# Patient Record
Sex: Female | Born: 1990 | Race: Black or African American | Hispanic: No | Marital: Single | Smoking: Current every day smoker
Health system: Southern US, Community
[De-identification: ages and names within clinical notes are randomized; demographics above are authoritative.]

## PROBLEM LIST (undated history)

## (undated) ENCOUNTER — Inpatient Hospital Stay (HOSPITAL_COMMUNITY): Payer: Self-pay

## (undated) DIAGNOSIS — Z789 Other specified health status: Secondary | ICD-10-CM

## (undated) HISTORY — PX: LUMBAR PUNCTURE: SHX1985

---

## 2008-02-19 ENCOUNTER — Other Ambulatory Visit: Admission: RE | Admit: 2008-02-19 | Discharge: 2008-02-19 | Payer: Self-pay | Admitting: Family Medicine

## 2011-03-27 ENCOUNTER — Inpatient Hospital Stay (INDEPENDENT_AMBULATORY_CARE_PROVIDER_SITE_OTHER)
Admission: RE | Admit: 2011-03-27 | Discharge: 2011-03-27 | Disposition: A | Discharge status: Home / Self Care | Payer: Self-pay | Source: Ambulatory Visit | Attending: Family Medicine | Admitting: Family Medicine

## 2011-03-27 DIAGNOSIS — S139XXA Sprain of joints and ligaments of unspecified parts of neck, initial encounter: Secondary | ICD-10-CM

## 2012-02-23 ENCOUNTER — Encounter (HOSPITAL_COMMUNITY): Payer: Self-pay | Admitting: *Deleted

## 2012-02-23 ENCOUNTER — Emergency Department (HOSPITAL_COMMUNITY)
Admission: EM | Admit: 2012-02-23 | Discharge: 2012-02-23 | Disposition: A | Discharge status: Home / Self Care | Payer: Self-pay | Attending: Emergency Medicine | Admitting: Emergency Medicine

## 2012-02-23 DIAGNOSIS — B349 Viral infection, unspecified: Secondary | ICD-10-CM

## 2012-02-23 DIAGNOSIS — R1084 Generalized abdominal pain: Secondary | ICD-10-CM | POA: Insufficient documentation

## 2012-02-23 DIAGNOSIS — R197 Diarrhea, unspecified: Secondary | ICD-10-CM | POA: Insufficient documentation

## 2012-02-23 DIAGNOSIS — F17200 Nicotine dependence, unspecified, uncomplicated: Secondary | ICD-10-CM | POA: Insufficient documentation

## 2012-02-23 DIAGNOSIS — B9789 Other viral agents as the cause of diseases classified elsewhere: Secondary | ICD-10-CM | POA: Insufficient documentation

## 2012-02-23 LAB — COMPREHENSIVE METABOLIC PANEL WITH GFR
Albumin: 3.9 g/dL (ref 3.5–5.2)
BUN: 8 mg/dL (ref 6–23)
Creatinine, Ser: 0.83 mg/dL (ref 0.50–1.10)
GFR calc Af Amer: >90 mL/min (ref >90)
Glucose, Bld: 114 mg/dL — ABNORMAL HIGH (ref 70–99)
Total Protein: 8.2 g/dL (ref 6.0–8.3)

## 2012-02-23 LAB — CBC WITH DIFFERENTIAL/PLATELET
Basophils Absolute: 0 K/uL (ref 0.0–0.1)
Basophils Relative: 0 % (ref 0–1)
Eosinophils Absolute: 0.1 K/uL (ref 0.0–0.7)
Eosinophils Relative: 1 % (ref 0–5)
HCT: 31.7 % — ABNORMAL LOW (ref 36.0–46.0)
Hemoglobin: 9.8 g/dL — ABNORMAL LOW (ref 12.0–15.0)
Lymphocytes Relative: 8 % — ABNORMAL LOW (ref 12–46)
Lymphs Abs: 1.1 K/uL (ref 0.7–4.0)
MCH: 19.3 pg — ABNORMAL LOW (ref 26.0–34.0)
MCHC: 30.9 g/dL (ref 30.0–36.0)
MCV: 62.4 fL — ABNORMAL LOW (ref 78.0–100.0)
Monocytes Absolute: 0.7 K/uL (ref 0.1–1.0)
Monocytes Relative: 5 % (ref 3–12)
Neutro Abs: 12.1 K/uL — ABNORMAL HIGH (ref 1.7–7.7)
Neutrophils Relative %: 86 % — ABNORMAL HIGH (ref 43–77)
Platelets: 388 10*3/uL (ref 150–400)
RBC: 5.08 MIL/uL (ref 3.87–5.11)
RDW: 17.3 % — ABNORMAL HIGH (ref 11.5–15.5)
WBC: 14 10*3/uL — ABNORMAL HIGH (ref 4.0–10.5)

## 2012-02-23 LAB — COMPREHENSIVE METABOLIC PANEL
ALT: 14 U/L (ref 0–35)
AST: 25 U/L (ref 0–37)
Alkaline Phosphatase: 59 U/L (ref 39–117)
CO2: 25 mEq/L (ref 19–32)
Calcium: 9.6 mg/dL (ref 8.4–10.5)
Chloride: 101 mEq/L (ref 96–112)
GFR calc non Af Amer: >90 mL/min (ref >90)
Potassium: 3.7 mEq/L (ref 3.5–5.1)
Sodium: 137 mEq/L (ref 135–145)
Total Bilirubin: 0.5 mg/dL (ref 0.3–1.2)

## 2012-02-23 LAB — URINALYSIS, ROUTINE W REFLEX MICROSCOPIC
Bilirubin Urine: NEGATIVE
Glucose, UA: NEGATIVE mg/dL
Hgb urine dipstick: NEGATIVE
Ketones, ur: 15 mg/dL — AB
Nitrite: NEGATIVE
Protein, ur: NEGATIVE mg/dL
Specific Gravity, Urine: 1.027 (ref 1.005–1.030)
Urobilinogen, UA: 0.2 mg/dL (ref 0.0–1.0)
pH: 5.5 (ref 5.0–8.0)

## 2012-02-23 LAB — PREGNANCY, URINE: Preg Test, Ur: NEGATIVE

## 2012-02-23 LAB — URINE MICROSCOPIC-ADD ON

## 2012-02-23 MED ORDER — GI COCKTAIL ~~LOC~~
30.0000 mL | Freq: Once | ORAL | Status: AC
  Administered 2012-02-23: 30 mL via ORAL
  Filled 2012-02-23: qty 30

## 2012-02-23 MED ORDER — ONDANSETRON HCL 4 MG/2ML IJ SOLN
4.0000 mg | Freq: Once | INTRAMUSCULAR | Status: AC
  Administered 2012-02-23: 4 mg via INTRAVENOUS
  Filled 2012-02-23: qty 2

## 2012-02-23 MED ORDER — PROMETHAZINE HCL 25 MG PO TABS: 25.0000 mg | ORAL_TABLET | Freq: Four times a day (QID) | ORAL | Status: DC | PRN

## 2012-02-23 MED ORDER — KETOROLAC TROMETHAMINE 30 MG/ML IJ SOLN
30.0000 mg | Freq: Once | INTRAMUSCULAR | Status: AC
  Administered 2012-02-23: 30 mg via INTRAVENOUS
  Filled 2012-02-23: qty 1

## 2012-02-23 NOTE — ED Notes (Signed)
The pt has had  Lower abd pain for 3 days with  and diarrhea.  lmp none implant

## 2012-02-23 NOTE — ED Notes (Signed)
Pt unable to urinate.

## 2012-02-23 NOTE — ED Provider Notes (Signed)
History     CSN: [id number]  Arrival date & time 02/23/12  0251   First MD Initiated Contact with Patient 02/23/12 [phone]      Chief Complaint  Patient presents with  . Abdominal Pain   HPI  History provided by the patient. Patient is a 21 year old female with no significant past medical history who presents with complaints of diarrhea and abdominal discomfort for the past 3 days. Symptoms have also been associated with nausea and decreased appetite. Patient reports having intermittent cramping abdominal pains. Pain is diffuse. Symptoms have been worsening over the past few days. Patient reports numerous watery diarrhea episodes. She denies any blood or mucus in stool. She denies any previous history of diarrhea constipation symptoms. No family history of colitis. Patient denies any fever, chills or sweats. She has not traveled recently. Denies any known sick contacts. Denies any dysuria, hematuria, urinary frequency, flank pain, vaginal bleeding or vaginal discharge.    History reviewed. No pertinent past medical history.  History reviewed. No pertinent past surgical history.  No family history on file.  History  Substance Use Topics  . Smoking status: Current Everyday Smoker  . Smokeless tobacco: Not on file  . Alcohol Use: Yes    OB History    Grav Para Term Preterm Abortions TAB SAB Ect Mult Living                  Review of Systems  Constitutional: Positive for appetite change. Negative for fever and chills.  Gastrointestinal: Positive for nausea, abdominal pain and diarrhea. Negative for vomiting and constipation.  Genitourinary: Negative for dysuria, frequency, hematuria, flank pain, vaginal bleeding and vaginal discharge.    Allergies  Review of patient's allergies indicates no known allergies.  Home Medications  No current outpatient prescriptions on file.  BP 134/81  Pulse 95  Temp 97.8 F (36.6 C) (Oral)  Resp 20  SpO2 98%  Physical Exam  Nursing note  and vitals reviewed. Constitutional: She is oriented to person, place, and time. She appears well-developed and well-nourished. No distress.  HENT:  Head: Normocephalic.  Mouth/Throat: Oropharynx is clear and moist.  Cardiovascular: Normal rate and regular rhythm.   Pulmonary/Chest: Effort normal and breath sounds normal.  Abdominal: Soft. She exhibits no distension and no mass. There is no rebound and no guarding.       Mild diffuse tenderness. Patient is obese.  Neurological: She is alert and oriented to person, place, and time.  Skin: Skin is warm and dry. No erythema.  Psychiatric: She has a normal mood and affect. Her behavior is normal.    ED Course  Procedures   Results for orders placed during the hospital encounter of 02/23/12  URINALYSIS, ROUTINE W REFLEX MICROSCOPIC      Component Value Range   Color, Urine YELLOW  YELLOW   APPearance CLEAR  CLEAR   Specific Gravity, Urine 1.027  1.005 - 1.030   pH 5.5  5.0 - 8.0   Glucose, UA NEGATIVE  NEGATIVE mg/dL   Hgb urine dipstick NEGATIVE  NEGATIVE   Bilirubin Urine NEGATIVE  NEGATIVE   Ketones, ur 15 (*) NEGATIVE mg/dL   Protein, ur NEGATIVE  NEGATIVE mg/dL   Urobilinogen, UA 0.2  0.0 - 1.0 mg/dL   Nitrite NEGATIVE  NEGATIVE   Leukocytes, UA SMALL (*) NEGATIVE  PREGNANCY, URINE      Component Value Range   Preg Test, Ur NEGATIVE  NEGATIVE  CBC WITH DIFFERENTIAL  Component Value Range   WBC 14.0 (*) 4.0 - 10.5 K/uL   RBC 5.08  3.87 - 5.11 MIL/uL   Hemoglobin 9.8 (*) 12.0 - 15.0 g/dL   HCT 40.9 (*) 81.1 - 91.4 %   MCV 62.4 (*) 78.0 - 100.0 fL   MCH 19.3 (*) 26.0 - 34.0 pg   MCHC 30.9  30.0 - 36.0 g/dL   RDW 78.2 (*) 95.6 - 21.3 %   Platelets 388  150 - 400 K/uL   Neutrophils Relative 86 (*) 43 - 77 %   Lymphocytes Relative 8 (*) 12 - 46 %   Monocytes Relative 5  3 - 12 %   Eosinophils Relative 1  0 - 5 %   Basophils Relative 0  0 - 1 %   Neutro Abs 12.1 (*) 1.7 - 7.7 K/uL   Lymphs Abs 1.1  0.7 - 4.0 K/uL    Monocytes Absolute 0.7  0.1 - 1.0 K/uL   Eosinophils Absolute 0.1  0.0 - 0.7 K/uL   Basophils Absolute 0.0  0.0 - 0.1 K/uL   RBC Morphology POLYCHROMASIA PRESENT    COMPREHENSIVE METABOLIC PANEL      Component Value Range   Sodium 137  135 - 145 mEq/L   Potassium 3.7  3.5 - 5.1 mEq/L   Chloride 101  96 - 112 mEq/L   CO2 25  19 - 32 mEq/L   Glucose, Bld 114 (*) 70 - 99 mg/dL   BUN 8  6 - 23 mg/dL   Creatinine, Ser 0.86  0.50 - 1.10 mg/dL   Calcium 9.6  8.4 - 57.8 mg/dL   Total Protein 8.2  6.0 - 8.3 g/dL   Albumin 3.9  3.5 - 5.2 g/dL   AST 25  0 - 37 U/L   ALT 14  0 - 35 U/L   Alkaline Phosphatase 59  39 - 117 U/L   Total Bilirubin 0.5  0.3 - 1.2 mg/dL   GFR calc non Af Amer >90  >90 mL/min   GFR calc Af Amer >90  >90 mL/min  LIPASE, BLOOD      Component Value Range   Lipase 23  11 - 59 U/L  URINE MICROSCOPIC-ADD ON      Component Value Range   Squamous Epithelial / LPF MANY (*) RARE   WBC, UA 0-2  <3 WBC/hpf   RBC / HPF 0-2  <3 RBC/hpf   Bacteria, UA MANY (*) RARE   Urine-Other MUCOUS PRESENT         1. Diarrhea   2. Viral syndrome       MDM  4:20AM patient seen and evaluated. Patient with nausea and diarrhea symptoms. Patient reports multiple watery loose stools. No blood or mucus. Patient with soft abdomen mild tenderness. No peritoneal signs.   Patient feeling better after pain medications and IV fluids. Reexam of the abdomen shows very mild diffuse tenderness. There is no signs concerning for appendicitis. No peritoneal signs. At this time given patient's amount of diarrhea symptoms suspect viral GI process. Patient given strict return precautions and advised to have recheck of symptoms in the next 2 days if not improved.        Angus Seller, [place] 02/24/12 [phone]

## 2012-02-24 NOTE — ED Provider Notes (Signed)
Medical screening examination/treatment/procedure(s) were performed by non-physician practitioner and as supervising physician I was immediately available for consultation/collaboration.   Gavin Pound. Oletta Lamas, MD 02/24/12 [phone]

## 2014-05-12 ENCOUNTER — Emergency Department (HOSPITAL_COMMUNITY)
Admission: EM | Admit: 2014-05-12 | Discharge: 2014-05-12 | Disposition: A | Discharge status: Home / Self Care | Payer: Self-pay | Attending: Emergency Medicine | Admitting: Emergency Medicine

## 2014-05-12 ENCOUNTER — Encounter (HOSPITAL_COMMUNITY): Payer: Self-pay | Admitting: Emergency Medicine

## 2014-05-12 DIAGNOSIS — Z720 Tobacco use: Secondary | ICD-10-CM | POA: Insufficient documentation

## 2014-05-12 DIAGNOSIS — J069 Acute upper respiratory infection, unspecified: Secondary | ICD-10-CM | POA: Insufficient documentation

## 2014-05-12 DIAGNOSIS — B9789 Other viral agents as the cause of diseases classified elsewhere: Secondary | ICD-10-CM

## 2014-05-12 DIAGNOSIS — R0981 Nasal congestion: Secondary | ICD-10-CM

## 2014-05-12 DIAGNOSIS — R0789 Other chest pain: Secondary | ICD-10-CM | POA: Insufficient documentation

## 2014-05-12 LAB — RAPID STREP SCREEN (MED CTR MEBANE ONLY): Streptococcus, Group A Screen (Direct): NEGATIVE

## 2014-05-12 MED ORDER — ALBUTEROL SULFATE HFA 108 (90 BASE) MCG/ACT IN AERS
2.0000 | INHALATION_SPRAY | RESPIRATORY_TRACT | Status: DC | PRN
  Administered 2014-05-12: 2 via RESPIRATORY_TRACT
  Filled 2014-05-12: qty 6.7

## 2014-05-12 MED ORDER — AEROCHAMBER PLUS W/MASK MISC
1.0000 | Freq: Once | Status: AC
  Administered 2014-05-12: 1
  Filled 2014-05-12: qty 1

## 2014-05-12 MED ORDER — BENZONATATE 100 MG PO CAPS: 100.0000 mg | ORAL_CAPSULE | Freq: Three times a day (TID) | ORAL | Status: DC

## 2014-05-12 MED ORDER — FLUTICASONE PROPIONATE 50 MCG/ACT NA SUSP: 2.0000 | Freq: Every day | NASAL | Status: DC

## 2014-05-12 NOTE — ED Notes (Addendum)
Congestion, non productive cough, pleurisy, and sore throat.

## 2014-05-12 NOTE — ED Notes (Signed)
Declined W/C at D/C and was escorted to lobby by RN. 

## 2014-05-12 NOTE — Discharge Instructions (Signed)
1. Medications: flonase, mucinex, tessalon, albuterolusual home medications 2. Treatment: rest, drink plenty of fluids, take tylenol or ibuprofen for fever control 3. Follow Up: Please followup with your primary doctor in 3 days for discussion of your diagnoses and further evaluation after today's visit; if you do not have a primary care doctor use the resource guide provided to find one; Return to the ER for high fevers, difficulty breathing or other concerning symptoms   Upper Respiratory Infection, Adult An upper respiratory infection (URI) is also known as the common cold. It is often caused by a type of germ (virus). Colds are easily spread (contagious). You can pass it to others by kissing, coughing, sneezing, or drinking out of the same glass. Usually, you get better in 1 or 2 weeks.  HOME CARE   Only take medicine as told by your doctor.  Use a warm mist humidifier or breathe in steam from a hot shower.  Drink enough water and fluids to keep your pee (urine) clear or pale yellow.  Get plenty of rest.  Return to work when your temperature is back to normal or as told by your doctor. You may use a face mask and wash your hands to stop your cold from spreading. GET HELP RIGHT AWAY IF:   After the first few days, you feel you are getting worse.  You have questions about your medicine.  You have chills, shortness of breath, or brown or red spit (mucus).  You have yellow or brown snot (nasal discharge) or pain in the face, especially when you bend forward.  You have a fever, puffy (swollen) neck, pain when you swallow, or white spots in the back of your throat.  You have a bad headache, ear pain, sinus pain, or chest pain.  You have a high-pitched whistling sound when you breathe in and out (wheezing).  You have a lasting cough or cough up blood.  You have sore muscles or a stiff neck. MAKE SURE YOU:   Understand these instructions.  Will watch your condition.  Will get  help right away if you are not doing well or get worse. Document Released: 12/25/2007 Document Revised: 09/30/2011 Document Reviewed: 10/13/2013 [facility] Patient Information 2015 [place], [place]. This information is not intended to replace advice given to you by your health care provider. Make sure you discuss any questions you have with your health care provider.

## 2014-05-12 NOTE — ED Provider Notes (Signed)
CSN: [id number]     Arrival date & time 05/12/14  1737 History  This chart was scribed for non-physician practitioner, Dierdre ForthHannah Shriyan Arakawa, PA-C, working with Audree CamelScott T Goldston, MD, by Bronson CurbJacqueline Melvin, ED Scribe. This patient was seen in room TR07C/TR07C and the patient's care was started at 5:58 PM.    Chief Complaint  Patient presents with  . Nasal Congestion  . Pleurisy  . Sore Throat    The history is provided by the patient and medical records. No language interpreter was used.    HPI Comments: Summer Lee is a 23 y.o. female who presents to the Emergency Department complaining of sore throat that began 3 days ago. Patient states her feels are consistent with the "common cold", but is here today to rule out other possibilities. There is associated nasal congestion, sinus pressure/pain, loss of appetite, cough, CP (with hard bouts of cough only) and SOB with cough only. She also notes subjective fever and chills, 3 days ago, that have since resolved. Patient has tried cough drops, hot tea, Mucinex, and Dayquil without significant improvement. She denies otalgia, nausea, or vomiting. She denies sick contacts. Patient has no history of significant medical conditions.   History reviewed. No pertinent past medical history. History reviewed. No pertinent past surgical history. No family history on file. History  Substance Use Topics  . Smoking status: Current Every Day Smoker  . Smokeless tobacco: Not on file  . Alcohol Use: Yes   OB History   Grav Para Term Preterm Abortions TAB SAB Ect Mult Living                 Review of Systems  Constitutional: Positive for appetite change (decreased) and fatigue. Negative for fever and chills.  HENT: Positive for congestion, postnasal drip, rhinorrhea, sinus pressure and sore throat. Negative for ear discharge, ear pain and mouth sores.   Eyes: Negative for visual disturbance.  Respiratory: Positive for cough, chest tightness (mild) and  shortness of breath. Negative for wheezing and stridor.   Cardiovascular: Positive for chest pain (with cough only). Negative for palpitations and leg swelling.  Gastrointestinal: Negative for nausea, vomiting, abdominal pain and diarrhea.  Genitourinary: Negative for dysuria, urgency, frequency and hematuria.  Musculoskeletal: Positive for myalgias. Negative for arthralgias, back pain and neck stiffness.  Skin: Negative for rash.  Neurological: Positive for headaches (frontal). Negative for syncope, light-headedness and numbness.  Hematological: Negative for adenopathy.  Psychiatric/Behavioral: The patient is not nervous/anxious.   All other systems reviewed and are negative.     Allergies  Review of patient's allergies indicates no known allergies.  Home Medications   Prior to Admission medications   Medication Sig Start Date End Date Taking? Authorizing Provider  benzonatate (TESSALON) 100 MG capsule Take 1-2 capsules (100-200 mg total) by mouth every 8 (eight) hours. 05/12/14   Teyona Nichelson, PA-C  fluticasone (FLONASE) 50 MCG/ACT nasal spray Place 2 sprays into both nostrils daily. 05/12/14   Jamin Humphries, PA-C  ibuprofen (ADVIL,MOTRIN) 200 MG tablet Take 600-800 mg by mouth every 6 (six) hours as needed. For pain    Historical Provider, MD  promethazine (PHENERGAN) 25 MG tablet Take 1 tablet (25 mg total) by mouth every 6 (six) hours as needed for nausea. 02/23/12 03/01/12  Angus SellerPeter S Dammen, PA-C   Triage Vitals: BP 121/92  Pulse 120  Temp(Src) 99 F (37.2 C)  Resp 18  Ht 5\' 6"  (1.676 m)  Wt 180 lb (81.647 kg)  BMI 29.07 kg/m2  SpO2  100%  Physical Exam  Nursing note and vitals reviewed. Constitutional: She is oriented to person, place, and time. She appears well-developed and well-nourished. No distress.  HENT:  Head: Normocephalic and atraumatic.  Right Ear: Tympanic membrane, external ear and ear canal normal.  Left Ear: Tympanic membrane, external ear and  ear canal normal.  Nose: Mucosal edema and rhinorrhea present. No epistaxis. Right sinus exhibits no maxillary sinus tenderness and no frontal sinus tenderness. Left sinus exhibits no maxillary sinus tenderness and no frontal sinus tenderness.  Mouth/Throat: Uvula is midline, oropharynx is clear and moist and mucous membranes are normal. Mucous membranes are not pale and not cyanotic. No oropharyngeal exudate, posterior oropharyngeal edema, posterior oropharyngeal erythema or tonsillar abscesses.  Eyes: Conjunctivae are normal. Pupils are equal, round, and reactive to light.  Neck: Normal range of motion and full passive range of motion without pain.  Cardiovascular: Normal rate, normal heart sounds and intact distal pulses.   No murmur heard. Pulmonary/Chest: Effort normal and breath sounds normal. No stridor. No respiratory distress. She has no wheezes.  Clear and equal breath sounds without focal wheezes, rhonchi, rales. Mild TTP to the anterior chest.  Abdominal: Soft. Bowel sounds are normal. She exhibits no distension. There is no tenderness.  Musculoskeletal: Normal range of motion.  Lymphadenopathy:    She has no cervical adenopathy.  Neurological: She is alert and oriented to person, place, and time.  Skin: Skin is warm and dry. No rash noted. She is not diaphoretic. No erythema.  Psychiatric: She has a normal mood and affect.    ED Course  Procedures (including critical care time)  DIAGNOSTIC STUDIES: Oxygen Saturation is 100% on room air, normal by my interpretation.    COORDINATION OF CARE: At 24 Discussed treatment plan with patient which includes rapid strep screen. Patient agrees.   Labs Review Labs Reviewed  RAPID STREP SCREEN  CULTURE, GROUP A STREP    Imaging Review No results found.   EKG Interpretation None      Medications  albuterol (PROVENTIL HFA;VENTOLIN HFA) 108 (90 BASE) MCG/ACT inhaler 2 puff (2 puffs Inhalation Given 05/12/14 1824)  aerochamber  plus with mask device 1 each (1 each Other Given 05/12/14 1827)     MDM   Final diagnoses:  Viral URI with cough  Chest wall pain  Sinus congestion   Summer Lee presents with URI symptoms.  Pt afebrile with clear and equal breath sounds, no indication for chest x-ray this time.. Patients symptoms are consistent with URI, likely viral etiology. Strep test  negative  Discussed that antibiotics are not indicated for viral infections. Pt will be discharged with symptomatic treatment.  Verbalizes understanding and is agreeable with plan. Pt is hemodynamically stable & in NAD prior to dc.  I have personally reviewed patient's vitals, nursing note and any pertinent labs or imaging.  I performed an focused physical exam; undressed when appropriate .    It has been determined that no acute conditions requiring further emergency intervention are present at this time. The patient/guardian have been advised of the diagnosis and plan. I reviewed any labs and imaging including any potential incidental findings. We have discussed signs and symptoms that warrant return to the ED and they are listed in the discharge instructions.    Vital signs are stable at discharge.  Pt initially tachycardic at triage, but no tachycardia on physical exam.  BP 121/92  Pulse 96  Temp(Src) 97.8 F (36.6 C) (Oral)  Resp 18  Ht 5'  6" (1.676 m)  Wt 180 lb (81.647 kg)  BMI 29.07 kg/m2  SpO2 100%  I personally performed the services described in this documentation, which was scribed in my presence. The recorded information has been reviewed and is accurate.   Dahlia ClientHannah Vonn Sliger, PA-C 05/12/14 1845

## 2014-05-13 NOTE — ED Provider Notes (Signed)
Medical screening examination/treatment/procedure(s) were performed by non-physician practitioner and as supervising physician I was immediately available for consultation/collaboration.   EKG Interpretation None        Audree CamelScott T Freemon Binford, MD 05/13/14 [phone]

## 2014-05-14 LAB — CULTURE, GROUP A STREP

## 2014-07-22 NOTE — L&D Delivery Note (Signed)
Delivery Note At 10:24 AM a viable and healthy female was delivered via Vaginal, Spontaneous Delivery (Presentation: Direct; Occiput Anterior).  APGAR: 8, 9; weight  pending.   Placenta status: Intact, Spontaneous.  Cord: 3 vessels with the a loose nuchal cord that could not be reduced prior to delivery of the body:    Anesthesia: Epidural  Episiotomy: None Lacerations:  None Suture Repair: None Est. Blood Loss (mL):  400  Mom to AICU.  Baby to Couplet care / Skin to Skin.  Jevan Gaunt H. 03/21/2015, 11:21 AM

## 2014-08-17 ENCOUNTER — Encounter (HOSPITAL_COMMUNITY): Payer: Self-pay | Admitting: *Deleted

## 2014-08-17 ENCOUNTER — Inpatient Hospital Stay (HOSPITAL_COMMUNITY): Payer: Self-pay

## 2014-08-17 ENCOUNTER — Inpatient Hospital Stay (HOSPITAL_COMMUNITY)
Admission: AD | Admit: 2014-08-17 | Discharge: 2014-08-17 | Disposition: A | Discharge status: Home / Self Care | Payer: Self-pay | Source: Ambulatory Visit | Attending: Obstetrics & Gynecology | Admitting: Obstetrics & Gynecology

## 2014-08-17 DIAGNOSIS — R1031 Right lower quadrant pain: Secondary | ICD-10-CM | POA: Insufficient documentation

## 2014-08-17 DIAGNOSIS — O26899 Other specified pregnancy related conditions, unspecified trimester: Secondary | ICD-10-CM

## 2014-08-17 DIAGNOSIS — O10011 Pre-existing essential hypertension complicating pregnancy, first trimester: Secondary | ICD-10-CM | POA: Insufficient documentation

## 2014-08-17 DIAGNOSIS — O9989 Other specified diseases and conditions complicating pregnancy, childbirth and the puerperium: Secondary | ICD-10-CM

## 2014-08-17 DIAGNOSIS — O99331 Smoking (tobacco) complicating pregnancy, first trimester: Secondary | ICD-10-CM | POA: Insufficient documentation

## 2014-08-17 DIAGNOSIS — Z3A01 Less than 8 weeks gestation of pregnancy: Secondary | ICD-10-CM | POA: Insufficient documentation

## 2014-08-17 DIAGNOSIS — R109 Unspecified abdominal pain: Secondary | ICD-10-CM

## 2014-08-17 HISTORY — DX: Other specified health status: Z78.9

## 2014-08-17 LAB — ABO/RH: ABO/RH(D): O POS

## 2014-08-17 LAB — URINALYSIS, ROUTINE W REFLEX MICROSCOPIC
Bilirubin Urine: NEGATIVE
Glucose, UA: NEGATIVE mg/dL
Hgb urine dipstick: NEGATIVE
Ketones, ur: NEGATIVE mg/dL
Leukocytes, UA: NEGATIVE
Nitrite: NEGATIVE
Protein, ur: NEGATIVE mg/dL
Specific Gravity, Urine: 1.01 (ref 1.005–1.030)
Urobilinogen, UA: 0.2 mg/dL (ref 0.0–1.0)
pH: 7 (ref 5.0–8.0)

## 2014-08-17 LAB — CBC
HCT: 30.1 % — ABNORMAL LOW (ref 36.0–46.0)
Hemoglobin: 9.4 g/dL — ABNORMAL LOW (ref 12.0–15.0)
MCH: 19.4 pg — ABNORMAL LOW (ref 26.0–34.0)
MCHC: 31.2 g/dL (ref 30.0–36.0)
MCV: 62.1 fL — ABNORMAL LOW (ref 78.0–100.0)
Platelets: 434 K/uL — ABNORMAL HIGH (ref 150–400)
RBC: 4.85 MIL/uL (ref 3.87–5.11)
RDW: 18.5 % — ABNORMAL HIGH (ref 11.5–15.5)
WBC: 8.1 K/uL (ref 4.0–10.5)

## 2014-08-17 LAB — WET PREP, GENITAL
TRICH WET PREP: NONE SEEN
Yeast Wet Prep HPF POC: NONE SEEN

## 2014-08-17 LAB — HCG, QUANTITATIVE, PREGNANCY: HCG, BETA CHAIN, QUANT, S: 69698 m[IU]/mL — AB (ref <5)

## 2014-08-17 LAB — POCT PREGNANCY, URINE: Preg Test, Ur: POSITIVE — AB

## 2014-08-17 MED ORDER — DOXYLAMINE-PYRIDOXINE 10-10 MG PO TBEC: 2.0000 | DELAYED_RELEASE_TABLET | Freq: Every evening | ORAL | Status: DC | PRN

## 2014-08-17 NOTE — MAU Note (Signed)
Urine in lab 

## 2014-08-17 NOTE — MAU Provider Note (Signed)
History   Summer Lee is a 24 y/o G1P0 [redacted]w[redacted]d female with a chief complaint of RLQ abdominal pain. Pt describes the pain as cramping and ranks it as a 9/10 in severity at its worse. Pt reports spotting of bright red blood x 2 days, but denies any heavy bleeding. Pt reports nausea and denies vomiting, vaginal discharge, contractions, or sharp pains. Positive at home pregnancy test 2 weeks ago. BP slightly elevated at 154/92.  CSN: [id number]  Arrival date and time: 08/17/14 1245   None     Chief Complaint  Patient presents with  . Abdominal Pain   HPI  OB History    Gravida Para Term Preterm AB TAB SAB Ectopic Multiple Living   1               Past Medical History  Diagnosis Date  . Medical history non-contributory     Past Surgical History  Procedure Laterality Date  . Lumbar puncture      Family History  Problem Relation Age of Onset  . Hypertension Mother   . Hypertension Father   . Hypertension Maternal Grandmother   . Hypertension Paternal Grandmother   . Hypertension Paternal Grandfather     History  Substance Use Topics  . Smoking status: Current Every Day Smoker -- 0.25 packs/day  . Smokeless tobacco: Not on file  . Alcohol Use: No    Allergies: No Known Allergies  Prescriptions prior to admission  Medication Sig Dispense Refill Last Dose  . benzonatate (TESSALON) 100 MG capsule Take 1-2 capsules (100-200 mg total) by mouth every 8 (eight) hours. (Patient not taking: Reported on 08/17/2014) 21 capsule 0   . fluticasone (FLONASE) 50 MCG/ACT nasal spray Place 2 sprays into both nostrils daily. (Patient not taking: Reported on 08/17/2014) 9.9 g 2   . promethazine (PHENERGAN) 25 MG tablet Take 1 tablet (25 mg total) by mouth every 6 (six) hours as needed for nausea. (Patient not taking: Reported on 08/17/2014) 20 tablet 0   . [DISCONTINUED] ibuprofen (ADVIL,MOTRIN) 200 MG tablet Take 600-800 mg by mouth every 6 (six) hours as needed. For pain   02/22/2012 at Unknown     ROS Physical Exam   Blood pressure 154/92, pulse 89, temperature 97.8 F (36.6 C), temperature source Oral, resp. rate 16, height  (1.676 m), weight 80.015 kg (176 lb 6.4 oz), last menstrual period 06/30/2014.  Physical Exam  Constitutional: She is oriented to person, place, and time. She appears well-developed and well-nourished. No distress.  Cardiovascular: Normal rate.   Respiratory: Effort normal and breath sounds normal.  GI: Soft.  Genitourinary: Uterus normal. Vaginal discharge (but no evidence of bleeding on pelvic exam) found.  Neurological: She is alert and oriented to person, place, and time.  Skin: Skin is warm and dry. She is not diaphoretic.  Psychiatric: She has a normal mood and affect.    MAU Course  Procedures  MDM Results for orders placed or performed during the hospital encounter of 08/17/14 (from the past 24 hour(s))  Urinalysis, Routine w reflex microscopic     Status: Abnormal   Collection Time: 08/17/14 12:50 PM  Result Value Ref Range   Color, Urine STRAW (A) YELLOW   APPearance CLEAR CLEAR   Specific Gravity, Urine 1.010 1.005 - 1.030   pH 7.0 5.0 - 8.0   Glucose, UA NEGATIVE NEGATIVE mg/dL   Hgb urine dipstick NEGATIVE NEGATIVE   Bilirubin Urine NEGATIVE NEGATIVE   Ketones, ur NEGATIVE NEGATIVE mg/dL  Protein, ur NEGATIVE NEGATIVE mg/dL   Urobilinogen, UA 0.2 0.0 - 1.0 mg/dL   Nitrite NEGATIVE NEGATIVE   Leukocytes, UA NEGATIVE NEGATIVE  hCG, quantitative, pregnancy     Status: Abnormal   Collection Time: 08/17/14  2:11 PM  Result Value Ref Range   hCG, Beta Chain, Quant, S [id number] (H) <5 mIU/mL  CBC     Status: Abnormal   Collection Time: 08/17/14  2:11 PM  Result Value Ref Range   WBC 8.1 4.0 - 10.5 K/uL   RBC 4.85 3.87 - 5.11 MIL/uL   Hemoglobin 9.4 (L) 12.0 - 15.0 g/dL   HCT 60.430.1 (L) 54.036.0 - 98.146.0 %   MCV 62.1 (L) 78.0 - 100.0 fL   MCH 19.4 (L) 26.0 - 34.0 pg   MCHC 31.2 30.0 - 36.0 g/dL   RDW 19.118.5 (H) 47.811.5 - 29.515.5 %    Platelets 434 (H) 150 - 400 K/uL  ABO/Rh     Status: None   Collection Time: 08/17/14  2:11 PM  Result Value Ref Range   ABO/RH(D) O POS   Pregnancy, urine POC     Status: Abnormal   Collection Time: 08/17/14  2:12 PM  Result Value Ref Range   Preg Test, Ur POSITIVE (A) NEGATIVE  Wet prep, genital     Status: Abnormal   Collection Time: 08/17/14  4:00 PM  Result Value Ref Range   Yeast Wet Prep HPF POC NONE SEEN NONE SEEN   Trich, Wet Prep NONE SEEN NONE SEEN   Clue Cells Wet Prep HPF POC FEW (A) NONE SEEN   WBC, Wet Prep HPF POC FEW (A) NONE SEEN   US showed SIUP at 73761w6d with good FHR and yolk sac, small SCH  Assessment and Plan  A- US showed 25761w6d intrauterine pregnancy with fetal pole and heart beat.       Chronic hypertension  P- Discharge home.       Reassured normal IUP      [facility] to [facility]  Tia Maskerrecht, [place] 08/17/2014, 4:12 PM   Attestation of Attending Supervision of Advanced Practitioner (CNM/NP): Evaluation and management procedures were performed by the Advanced Practitioner under my supervision and collaboration.  I have reviewed the Advanced Practitioner's note and chart, and I agree with the management and plan.  HARRAWAY-SMITH, Jennette Leask 5:07 PM

## 2014-08-17 NOTE — Discharge Instructions (Signed)
First Trimester of Pregnancy The first trimester of pregnancy is from week 1 until the end of week 12 (months 1 through 3). A week after a sperm fertilizes an egg, the egg will implant on the wall of the uterus. This embryo will begin to develop into a baby. Genes from you and your partner are forming the baby. The female genes determine whether the baby is a boy or a girl. At 6-8 weeks, the eyes and face are formed, and the heartbeat can be seen on ultrasound. At the end of 12 weeks, all the baby's organs are formed.  Now that you are pregnant, you will want to do everything you can to have a healthy baby. Two of the most important things are to get good prenatal care and to follow your health care provider's instructions. Prenatal care is all the medical care you receive before the baby's birth. This care will help prevent, find, and treat any problems during the pregnancy and childbirth. BODY CHANGES Your body goes through many changes during pregnancy. The changes vary from woman to woman.   You may gain or lose a couple of pounds at first.  You may feel sick to your stomach (nauseous) and throw up (vomit). If the vomiting is uncontrollable, call your health care provider.  You may tire easily.  You may develop headaches that can be relieved by medicines approved by your health care provider.  You may urinate more often. Painful urination may mean you have a bladder infection.  You may develop heartburn as a result of your pregnancy.  You may develop constipation because certain hormones are causing the muscles that push waste through your intestines to slow down.  You may develop hemorrhoids or swollen, bulging veins (varicose veins).  Your breasts may begin to grow larger and become tender. Your nipples may stick out more, and the tissue that surrounds them (areola) may become darker.  Your gums may bleed and may be sensitive to brushing and flossing.  Dark spots or blotches (chloasma,  mask of pregnancy) may develop on your face. This will likely fade after the baby is born.  Your menstrual periods will stop.  You may have a loss of appetite.  You may develop cravings for certain kinds of food.  You may have changes in your emotions from day to day, such as being excited to be pregnant or being concerned that something may go wrong with the pregnancy and baby.  You may have more vivid and strange dreams.  You may have changes in your hair. These can include thickening of your hair, rapid growth, and changes in texture. Some women also have hair loss during or after pregnancy, or hair that feels dry or thin. Your hair will most likely return to normal after your baby is born. WHAT TO EXPECT AT YOUR PRENATAL VISITS During a routine prenatal visit:  You will be weighed to make sure you and the baby are growing normally.  Your blood pressure will be taken.  Your abdomen will be measured to track your baby's growth.  The fetal heartbeat will be listened to starting around week 10 or 12 of your pregnancy.  Test results from any previous visits will be discussed. Your health care provider may ask you:  How you are feeling.  If you are feeling the baby move.  If you have had any abnormal symptoms, such as leaking fluid, bleeding, severe headaches, or abdominal cramping.  If you have any questions. Other tests   that may be performed during your first trimester include:  Blood tests to find your blood type and to check for the presence of any previous infections. They will also be used to check for low iron levels (anemia) and Rh antibodies. Later in the pregnancy, blood tests for diabetes will be done along with other tests if problems develop.  Urine tests to check for infections, diabetes, or protein in the urine.  An ultrasound to confirm the proper growth and development of the baby.  An amniocentesis to check for possible genetic problems.  Fetal screens for  spina bifida and Down syndrome.  You may need other tests to make sure you and the baby are doing well. HOME CARE INSTRUCTIONS  Medicines  Follow your health care provider's instructions regarding medicine use. Specific medicines may be either safe or unsafe to take during pregnancy.  Take your prenatal vitamins as directed.  If you develop constipation, try taking a stool softener if your health care provider approves. Diet  Eat regular, well-balanced meals. Choose a variety of foods, such as meat or vegetable-based protein, fish, milk and low-fat dairy products, vegetables, fruits, and whole grain breads and cereals. Your health care provider will help you determine the amount of weight gain that is right for you.  Avoid raw meat and uncooked cheese. These carry germs that can cause birth defects in the baby.  Eating four or five small meals rather than three large meals a day may help relieve nausea and vomiting. If you start to feel nauseous, eating a few soda crackers can be helpful. Drinking liquids between meals instead of during meals also seems to help nausea and vomiting.  If you develop constipation, eat more high-fiber foods, such as fresh vegetables or fruit and whole grains. Drink enough fluids to keep your urine clear or pale yellow. Activity and Exercise  Exercise only as directed by your health care provider. Exercising will help you:  Control your weight.  Stay in shape.  Be prepared for labor and delivery.  Experiencing pain or cramping in the lower abdomen or low back is a good sign that you should stop exercising. Check with your health care provider before continuing normal exercises.  Try to avoid standing for long periods of time. Move your legs often if you must stand in one place for a long time.  Avoid heavy lifting.  Wear low-heeled shoes, and practice good posture.  You may continue to have sex unless your health care provider directs you  otherwise. Relief of Pain or Discomfort  Wear a good support bra for breast tenderness.   Take warm sitz baths to soothe any pain or discomfort caused by hemorrhoids. Use hemorrhoid cream if your health care provider approves.   Rest with your legs elevated if you have leg cramps or low back pain.  If you develop varicose veins in your legs, wear support hose. Elevate your feet for 15 minutes, 3-4 times a day. Limit salt in your diet. Prenatal Care  Schedule your prenatal visits by the twelfth week of pregnancy. They are usually scheduled monthly at first, then more often in the last 2 months before delivery.  Write down your questions. Take them to your prenatal visits.  Keep all your prenatal visits as directed by your health care provider. Safety  Wear your seat belt at all times when driving.  Make a list of emergency phone numbers, including numbers for family, friends, the hospital, and police and fire departments. General Tips    Ask your health care provider for a referral to a local prenatal education class. Begin classes no later than at the beginning of month 6 of your pregnancy.  Ask for help if you have counseling or nutritional needs during pregnancy. Your health care provider can offer advice or refer you to specialists for help with various needs.  Do not use hot tubs, steam rooms, or saunas.  Do not douche or use tampons or scented sanitary pads.  Do not cross your legs for long periods of time.  Avoid cat litter boxes and soil used by cats. These carry germs that can cause birth defects in the baby and possibly loss of the fetus by miscarriage or stillbirth.  Avoid all smoking, herbs, alcohol, and medicines not prescribed by your health care provider. Chemicals in these affect the formation and growth of the baby.  Schedule a dentist appointment. At home, brush your teeth with a soft toothbrush and be gentle when you floss. SEEK MEDICAL CARE IF:   You have  dizziness.  You have mild pelvic cramps, pelvic pressure, or nagging pain in the abdominal area.  You have persistent nausea, vomiting, or diarrhea.  You have a bad smelling vaginal discharge.  You have pain with urination.  You notice increased swelling in your face, hands, legs, or ankles. SEEK IMMEDIATE MEDICAL CARE IF:   You have a fever.  You are leaking fluid from your vagina.  You have spotting or bleeding from your vagina.  You have severe abdominal cramping or pain.  You have rapid weight gain or loss.  You vomit blood or material that looks like coffee grounds.  You are exposed to German measles and have never had them.  You are exposed to fifth disease or chickenpox.  You develop a severe headache.  You have shortness of breath.  You have any kind of trauma, such as from a fall or a car accident. Document Released: 07/02/2001 Document Revised: 11/22/2013 Document Reviewed: 05/18/2013 Exit[employer] Patient Information 2015 [employer]. This information is not intended to replace advice given to you by your health care provider. Make sure you discuss any questions you have with your health care provider.  

## 2014-08-17 NOTE — MAU Note (Signed)
Pos HPT 2 weeks ago, RLQ pain started Saturday or Sunday, has become more intense.  Was spotting yesterday, not today.  Is nauseated, no vomiting or diarrhea.  No dysuria.

## 2014-08-18 LAB — GC/CHLAMYDIA PROBE AMP (~~LOC~~) NOT AT ARMC
CHLAMYDIA, DNA PROBE: NEGATIVE
Neisseria Gonorrhea: NEGATIVE

## 2014-08-19 LAB — RPR: RPR Ser Ql: NONREACTIVE

## 2014-08-19 LAB — HIV ANTIBODY (ROUTINE TESTING W REFLEX): HIV Screen 4th Generation wRfx: NONREACTIVE

## 2014-09-13 ENCOUNTER — Encounter (HOSPITAL_COMMUNITY): Payer: Self-pay | Admitting: *Deleted

## 2014-09-13 ENCOUNTER — Inpatient Hospital Stay (HOSPITAL_COMMUNITY)
Admission: AD | Admit: 2014-09-13 | Discharge: 2014-09-13 | Disposition: A | Discharge status: Home / Self Care | Payer: Medicaid Other | Source: Ambulatory Visit | Attending: Family Medicine | Admitting: Family Medicine

## 2014-09-13 DIAGNOSIS — Z3A09 9 weeks gestation of pregnancy: Secondary | ICD-10-CM | POA: Diagnosis not present

## 2014-09-13 DIAGNOSIS — O9989 Other specified diseases and conditions complicating pregnancy, childbirth and the puerperium: Secondary | ICD-10-CM | POA: Insufficient documentation

## 2014-09-13 DIAGNOSIS — A084 Viral intestinal infection, unspecified: Secondary | ICD-10-CM | POA: Insufficient documentation

## 2014-09-13 DIAGNOSIS — O99331 Smoking (tobacco) complicating pregnancy, first trimester: Secondary | ICD-10-CM | POA: Diagnosis not present

## 2014-09-13 DIAGNOSIS — O210 Mild hyperemesis gravidarum: Secondary | ICD-10-CM | POA: Diagnosis present

## 2014-09-13 LAB — URINALYSIS, ROUTINE W REFLEX MICROSCOPIC
Bilirubin Urine: NEGATIVE
Glucose, UA: 250 mg/dL — AB
HGB URINE DIPSTICK: NEGATIVE
KETONES UR: NEGATIVE mg/dL
Leukocytes, UA: NEGATIVE
Nitrite: NEGATIVE
PROTEIN: NEGATIVE mg/dL
Specific Gravity, Urine: 1.01 (ref 1.005–1.030)
UROBILINOGEN UA: 0.2 mg/dL (ref 0.0–1.0)
pH: 5.5 (ref 5.0–8.0)

## 2014-09-13 LAB — COMPREHENSIVE METABOLIC PANEL
ALT: 11 U/L (ref 0–35)
AST: 20 U/L (ref 0–37)
Albumin: 3.8 g/dL (ref 3.5–5.2)
Alkaline Phosphatase: 39 U/L (ref 39–117)
Anion gap: 6 (ref 5–15)
BILIRUBIN TOTAL: 0.5 mg/dL (ref 0.3–1.2)
BUN: 6 mg/dL (ref 6–23)
CO2: 19 mmol/L (ref 19–32)
CREATININE: 0.52 mg/dL (ref 0.50–1.10)
Calcium: 9.3 mg/dL (ref 8.4–10.5)
Chloride: 107 mmol/L (ref 96–112)
GFR calc Af Amer: >90 mL/min (ref >90)
GLUCOSE: 86 mg/dL (ref 70–99)
Potassium: 4 mmol/L (ref 3.5–5.1)
Sodium: 132 mmol/L — ABNORMAL LOW (ref 135–145)
Total Protein: 8 g/dL (ref 6.0–8.3)

## 2014-09-13 LAB — OB RESULTS CONSOLE ABO/RH: RH Type: POSITIVE

## 2014-09-13 LAB — CBC
HCT: 32.5 % — ABNORMAL LOW (ref 36.0–46.0)
HEMOGLOBIN: 10.2 g/dL — AB (ref 12.0–15.0)
MCH: 19.4 pg — ABNORMAL LOW (ref 26.0–34.0)
MCHC: 31.4 g/dL (ref 30.0–36.0)
MCV: 61.7 fL — ABNORMAL LOW (ref 78.0–100.0)
Platelets: 472 10*3/uL — ABNORMAL HIGH (ref 150–400)
RBC: 5.27 MIL/uL — ABNORMAL HIGH (ref 3.87–5.11)
RDW: 18.2 % — AB (ref 11.5–15.5)
WBC: 17 10*3/uL — AB (ref 4.0–10.5)

## 2014-09-13 LAB — OB RESULTS CONSOLE HIV ANTIBODY (ROUTINE TESTING): HIV: NONREACTIVE

## 2014-09-13 LAB — OB RESULTS CONSOLE ANTIBODY SCREEN: ANTIBODY SCREEN: NEGATIVE

## 2014-09-13 MED ORDER — DEXTROSE 5 % IN LACTATED RINGERS IV BOLUS
1000.0000 mL | Freq: Once | INTRAVENOUS | Status: AC
  Administered 2014-09-13: 1000 mL via INTRAVENOUS

## 2014-09-13 MED ORDER — FAMOTIDINE IN NACL 20-0.9 MG/50ML-% IV SOLN
20.0000 mg | Freq: Once | INTRAVENOUS | Status: AC
  Administered 2014-09-13: 20 mg via INTRAVENOUS
  Filled 2014-09-13: qty 50

## 2014-09-13 MED ORDER — PROMETHAZINE HCL 25 MG PO TABS: 25.0000 mg | ORAL_TABLET | Freq: Four times a day (QID) | ORAL | Status: DC | PRN

## 2014-09-13 MED ORDER — METOCLOPRAMIDE HCL 5 MG/ML IJ SOLN
10.0000 mg | Freq: Once | INTRAMUSCULAR | Status: AC
  Administered 2014-09-13: 10 mg via INTRAVENOUS
  Filled 2014-09-13: qty 2

## 2014-09-13 NOTE — MAU Note (Signed)
Been having bad diarrhea and vomiting.  (million, watery) Started at 0300.  Feels weak.  Thinks she may have been around some sick people this weekend. No fever.

## 2014-09-13 NOTE — MAU Provider Note (Signed)
History     CSN: [id number]  Arrival date and time: 09/13/14 1552   First Provider Initiated Contact with Patient 09/13/14 1731      Chief Complaint  Patient presents with  . Diarrhea  . Emesis   HPIpt is G1P0 2316w5d with confirmed IUP who presents with vomiting and diarrhea, feeling weak.  Pt denies spotting or  Bleeding; has some abdominal cramping with diarrhea  RN note: Been having bad diarrhea and vomiting. (million, watery) Started at 0300. Feels weak. Thinks she may have been around some sick people this weekend. No fever.         Past Medical History  Diagnosis Date  . Medical history non-contributory     Past Surgical History  Procedure Laterality Date  . Lumbar puncture      Family History  Problem Relation Age of Onset  . Hypertension Mother   . Hypertension Father   . Hypertension Maternal Grandmother   . Hypertension Paternal Grandmother   . Hypertension Paternal Grandfather     History  Substance Use Topics  . Smoking status: Current Every Day Smoker -- 0.25 packs/day  . Smokeless tobacco: Not on file  . Alcohol Use: No    Allergies: No Known Allergies  Prescriptions prior to admission  Medication Sig Dispense Refill Last Dose  . benzonatate (TESSALON) 100 MG capsule Take 1-2 capsules (100-200 mg total) by mouth every 8 (eight) hours. (Patient not taking: Reported on 08/17/2014) 21 capsule 0 Not Taking at Unknown time  . Doxylamine-Pyridoxine (DICLEGIS) 10-10 MG TBEC Take 2 tablets by mouth at bedtime as needed. (Patient not taking: Reported on 09/13/2014) 100 tablet 2 Not Taking at Unknown time  . fluticasone (FLONASE) 50 MCG/ACT nasal spray Place 2 sprays into both nostrils daily. (Patient not taking: Reported on 08/17/2014) 9.9 g 2 Not Taking at Unknown time  . promethazine (PHENERGAN) 25 MG tablet Take 1 tablet (25 mg total) by mouth every 6 (six) hours as needed for nausea. (Patient not taking: Reported on 08/17/2014) 20 tablet 0      Review of Systems  Constitutional: Negative for fever and chills.  Gastrointestinal: Positive for abdominal pain, diarrhea and constipation. Negative for nausea and vomiting.   Physical Exam   Blood pressure 143/88, pulse 84, temperature 98.2 F (36.8 C), temperature source Oral, resp. rate 18, weight 172 lb (78.019 kg), last menstrual period 06/30/2014, SpO2 100 %.  Physical Exam  Nursing note and vitals reviewed. Constitutional: She is oriented to person, place, and time. She appears well-developed and well-nourished. No distress.  HENT:  Head: Normocephalic.  Eyes: Pupils are equal, round, and reactive to light.  Neck: Normal range of motion. Neck supple.  Cardiovascular: Normal rate.   Respiratory: Effort normal.  GI: Soft.  Musculoskeletal: Normal range of motion.  Neurological: She is alert and oriented to person, place, and time.  Skin: Skin is warm and dry.  Psychiatric: She has a normal mood and affect.    MAU Course  Procedures Results for orders placed or performed during the hospital encounter of 09/13/14 (from the past 24 hour(s))  CBC     Status: Abnormal   Collection Time: 09/13/14  5:02 PM  Result Value Ref Range   WBC 17.0 (H) 4.0 - 10.5 K/uL   RBC 5.27 (H) 3.87 - 5.11 MIL/uL   Hemoglobin 10.2 (L) 12.0 - 15.0 g/dL   HCT 13.232.5 (L) 44.036.0 - 10.246.0 %   MCV 61.7 (L) 78.0 - 100.0 fL   MCH 19.4 (  L) 26.0 - 34.0 pg   MCHC 31.4 30.0 - 36.0 g/dL   RDW 16.1 (H) 09.6 - 04.5 %   Platelets 472 (H) 150 - 400 K/uL  Comprehensive metabolic panel     Status: Abnormal   Collection Time: 09/13/14  5:02 PM  Result Value Ref Range   Sodium 132 (L) 135 - 145 mmol/L   Potassium 4.0 3.5 - 5.1 mmol/L   Chloride 107 96 - 112 mmol/L   CO2 19 19 - 32 mmol/L   Glucose, Bld 86 70 - 99 mg/dL   BUN 6 6 - 23 mg/dL   Creatinine, Ser 4.09 0.50 - 1.10 mg/dL   Calcium 9.3 8.4 - 81.1 mg/dL   Total Protein 8.0 6.0 - 8.3 g/dL   Albumin 3.8 3.5 - 5.2 g/dL   AST 20 0 - 37 U/L   ALT 11 0  - 35 U/L   Alkaline Phosphatase 39 39 - 117 U/L   Total Bilirubin 0.5 0.3 - 1.2 mg/dL   GFR calc non Af Amer >90 >90 mL/min   GFR calc Af Amer >90 >90 mL/min   Anion gap 6 5 - 15  IV D5LR with pepcid  IV given No vomiting while pt here- pt slept whole time expect for several trips to bathroom Pt unable to get a urine specimen- Review of labs- pt has hx of elevated WBCs Has continued to have diarrhea -pt  unable to get specimen for culture Has appointment March 5 for OB appointment with [facility]   Assessment and Plan  Viral gastroenteritis- push fluids- BRAT diet Phenergan Rx F/u with [facility] 09/13/2014, 8:18 PM

## 2014-09-15 ENCOUNTER — Encounter (HOSPITAL_COMMUNITY): Payer: Self-pay | Admitting: *Deleted

## 2014-09-15 ENCOUNTER — Inpatient Hospital Stay (HOSPITAL_COMMUNITY)
Admission: AD | Admit: 2014-09-15 | Discharge: 2014-09-15 | Disposition: A | Discharge status: Home / Self Care | Payer: Medicaid Other | Source: Ambulatory Visit | Attending: Obstetrics & Gynecology | Admitting: Obstetrics & Gynecology

## 2014-09-15 DIAGNOSIS — R198 Other specified symptoms and signs involving the digestive system and abdomen: Secondary | ICD-10-CM

## 2014-09-15 DIAGNOSIS — O99611 Diseases of the digestive system complicating pregnancy, first trimester: Secondary | ICD-10-CM

## 2014-09-15 DIAGNOSIS — O99331 Smoking (tobacco) complicating pregnancy, first trimester: Secondary | ICD-10-CM | POA: Diagnosis not present

## 2014-09-15 DIAGNOSIS — O9989 Other specified diseases and conditions complicating pregnancy, childbirth and the puerperium: Secondary | ICD-10-CM | POA: Diagnosis not present

## 2014-09-15 DIAGNOSIS — O26891 Other specified pregnancy related conditions, first trimester: Secondary | ICD-10-CM

## 2014-09-15 DIAGNOSIS — Z3A10 10 weeks gestation of pregnancy: Secondary | ICD-10-CM | POA: Insufficient documentation

## 2014-09-15 DIAGNOSIS — R197 Diarrhea, unspecified: Secondary | ICD-10-CM | POA: Insufficient documentation

## 2014-09-15 DIAGNOSIS — O210 Mild hyperemesis gravidarum: Secondary | ICD-10-CM | POA: Insufficient documentation

## 2014-09-15 LAB — URINE MICROSCOPIC-ADD ON

## 2014-09-15 LAB — URINALYSIS, ROUTINE W REFLEX MICROSCOPIC
BILIRUBIN URINE: NEGATIVE
GLUCOSE, UA: NEGATIVE mg/dL
Hgb urine dipstick: NEGATIVE
KETONES UR: NEGATIVE mg/dL
NITRITE: NEGATIVE
Protein, ur: NEGATIVE mg/dL
Urobilinogen, UA: 0.2 mg/dL (ref 0.0–1.0)
pH: 6 (ref 5.0–8.0)

## 2014-09-15 NOTE — MAU Provider Note (Signed)
History     CSN: [id number]  Arrival date and time: 09/15/14 2007   First Provider Initiated Contact with Patient 09/15/14 2105      Chief Complaint  Patient presents with  . Nausea  . Diarrhea   HPI Summer Lee 23 y.o. G1P0 @[redacted]w[redacted]d  presents to MAU complaining of continued diarrhea x 2 days.  She is concerned that she is dehydrated and may need fluids again.  Her symptoms started with vomiting (when she was seen in MAU 2 days ago) but then changed to diarrhea.  Her vomiting has improved with none today.  She last used phenergan at 5pm.    She has had lots of gatorade to drink.  The only thing she ate today was a burger.    It caused her stomach to cramp and diarrhea.  She notes weakness.  She denies HA, vaginal bleeding, LOF, dysuria.    [facility]   OB History    Gravida Para Term Preterm AB TAB SAB Ectopic Multiple Living   1               Past Medical History  Diagnosis Date  . Medical history non-contributory     Past Surgical History  Procedure Laterality Date  . Lumbar puncture      Family History  Problem Relation Age of Onset  . Hypertension Mother   . Hypertension Father   . Hypertension Maternal Grandmother   . Hypertension Paternal Grandmother   . Hypertension Paternal Grandfather     History  Substance Use Topics  . Smoking status: Current Every Day Smoker -- 0.25 packs/day  . Smokeless tobacco: Not on file  . Alcohol Use: No    Allergies: No Known Allergies  Prescriptions prior to admission  Medication Sig Dispense Refill Last Dose  . Doxylamine-Pyridoxine (DICLEGIS) 10-10 MG TBEC Take 2 tablets by mouth at bedtime as needed. (Patient not taking: Reported on 09/13/2014) 100 tablet 2 Not Taking at Unknown time  . promethazine (PHENERGAN) 25 MG tablet Take 1 tablet (25 mg total) by mouth every 6 (six) hours as needed for nausea. (Patient not taking: Reported on 08/17/2014) 20 tablet 0   . promethazine (PHENERGAN) 25 MG tablet  Take 1 tablet (25 mg total) by mouth every 6 (six) hours as needed for nausea or vomiting. 30 tablet 0     ROS Pertinent ROS in HPI  Physical Exam   Blood pressure 122/79, pulse 87, temperature 98.4 F (36.9 C), temperature source Oral, resp. rate 16, height 5\' 6"  (1.676 m), weight 175 lb (79.379 kg), last menstrual period 06/30/2014, SpO2 100 %.  Physical Exam  Constitutional: She is oriented to person, place, and time. She appears well-developed and well-nourished. No distress.  HENT:  Head: Normocephalic and atraumatic.  Eyes: EOM are normal.  Neck: Normal range of motion.  Cardiovascular: Normal rate and regular rhythm.   Respiratory: Effort normal and breath sounds normal. No respiratory distress.  GI: Soft. Bowel sounds are normal. She exhibits no distension. There is no tenderness. There is no rebound and no guarding.  Musculoskeletal: Normal range of motion.  Neurological: She is alert and oriented to person, place, and time.  Skin: Skin is warm and dry.  Psychiatric: She has a normal mood and affect.   Results for orders placed or performed during the hospital encounter of 09/15/14 (from the past 24 hour(s))  Urinalysis, Routine w reflex microscopic     Status: Abnormal   Collection Time:  09/15/14  8:07 PM  Result Value Ref Range   Color, Urine YELLOW YELLOW   APPearance CLEAR CLEAR   Specific Gravity, Urine <1.005 (L) 1.005 - 1.030   pH 6.0 5.0 - 8.0   Glucose, UA NEGATIVE NEGATIVE mg/dL   Hgb urine dipstick NEGATIVE NEGATIVE   Bilirubin Urine NEGATIVE NEGATIVE   Ketones, ur NEGATIVE NEGATIVE mg/dL   Protein, ur NEGATIVE NEGATIVE mg/dL   Urobilinogen, UA 0.2 0.0 - 1.0 mg/dL   Nitrite NEGATIVE NEGATIVE   Leukocytes, UA SMALL (A) NEGATIVE  Urine microscopic-add on     Status: Abnormal   Collection Time: 09/15/14  8:07 PM  Result Value Ref Range   Squamous Epithelial / LPF FEW (A) RARE   WBC, UA 0-2 <3 WBC/hpf   RBC / HPF 0-2 <3 RBC/hpf   Bacteria, UA RARE RARE     MAU Course  Procedures  MDM No evidence for dehydration.  Pt is not following BRAT diet which is leading to a worsening of symptoms.  Weakness likely related to poor intake.  Pt offered crackers and she states she just wants to go home.    Assessment and Plan  A:  1. Gastrointestinal symptoms during pregnancy in first trimester, antepartum    P: Discharge to home BRAT diet discussed at length.   Continue good po fluid intake Keep appt for [facility] qd Patient may return to MAU as needed or if her condition were to change or worsen   Bertram Denver 09/15/2014, 9:05 PM

## 2014-09-15 NOTE — Discharge Instructions (Signed)
Abdominal Pain During Pregnancy Belly (abdominal) pain is common during pregnancy. Most of the time, it is not a serious problem. Other times, it can be a sign that something is wrong with the pregnancy. Always tell your doctor if you have belly pain. HOME CARE Monitor your belly pain for any changes. The following actions may help you feel better:  Do not have sex (intercourse) or put anything in your vagina until you feel better.  Rest until your pain stops.  Drink clear fluids if you feel sick to your stomach (nauseous). Do not eat solid food until you feel better.  Only take medicine as told by your doctor.  Keep all doctor visits as told. GET HELP RIGHT AWAY IF:   You are bleeding, leaking fluid, or pieces of tissue come out of your vagina.  You have more pain or cramping.  You keep throwing up (vomiting).  You have pain when you pee (urinate) or have blood in your pee.  You have a fever.  You do not feel your baby moving as much.  You feel very weak or feel like passing out.  You have trouble breathing, with or without belly pain.  You have a very bad headache and belly pain.  You have fluid leaking from your vagina and belly pain.  You keep having watery poop (diarrhea).  Your belly pain does not go away after resting, or the pain gets worse. MAKE SURE YOU:   Understand these instructions.  Will watch your condition.  Will get help right away if you are not doing well or get worse. Document Released: 06/26/2009 Document Revised: 03/10/2013 Document Reviewed: 02/04/2013 [facility] Patient Information 2015 [place], [place]. This information is not intended to replace advice given to you by your health care provider. Make sure you discuss any questions you have with your health care provider.  Abdominal Pain During Pregnancy Belly (abdominal) pain is common during pregnancy. Most of the time, it is not a serious problem. Other times, it can be a sign that something  is wrong with the pregnancy. Always tell your doctor if you have belly pain. HOME CARE Monitor your belly pain for any changes. The following actions may help you feel better:  Do not have sex (intercourse) or put anything in your vagina until you feel better.  Rest until your pain stops.  Drink clear fluids if you feel sick to your stomach (nauseous). Do not eat solid food until you feel better.  Only take medicine as told by your doctor.  Keep all doctor visits as told. GET HELP RIGHT AWAY IF:   You are bleeding, leaking fluid, or pieces of tissue come out of your vagina.  You have more pain or cramping.  You keep throwing up (vomiting).  You have pain when you pee (urinate) or have blood in your pee.  You have a fever.  You do not feel your baby moving as much.  You feel very weak or feel like passing out.  You have trouble breathing, with or without belly pain.  You have a very bad headache and belly pain.  You have fluid leaking from your vagina and belly pain.  You keep having watery poop (diarrhea).  Your belly pain does not go away after resting, or the pain gets worse. MAKE SURE YOU:   Understand these instructions.  Will watch your condition.  Will get help right away if you are not doing well or get worse. Document Released: 06/26/2009 Document Revised: 03/10/2013  Document Reviewed: 02/04/2013 [facility] Patient Information 2015 [place], [place]. This information is not intended to replace advice given to you by your health care provider. Make sure you discuss any questions you have with your health care provider. Food Choices to Help Relieve Diarrhea When you have diarrhea, the foods you eat and your eating habits are very important. Choosing the right foods and drinks can help relieve diarrhea. Also, because diarrhea can last up to 7 days, you need to replace lost fluids and electrolytes (such as sodium, potassium, and chloride) in order to help prevent  dehydration.  WHAT GENERAL GUIDELINES DO I NEED TO FOLLOW?  Slowly drink 1 cup (8 oz) of fluid for each episode of diarrhea. If you are getting enough fluid, your urine will be clear or pale yellow.  Eat starchy foods. Some good choices include white rice, white toast, pasta, low-fiber cereal, baked potatoes (without the skin), saltine crackers, and bagels.  Avoid large servings of any cooked vegetables.  Limit fruit to two servings per day. A serving is  cup or 1 small piece.  Choose foods with less than 2 g of fiber per serving.  Limit fats to less than 8 tsp (38 g) per day.  Avoid fried foods.  Eat foods that have probiotics in them. Probiotics can be found in certain dairy products.  Avoid foods and beverages that may increase the speed at which food moves through the stomach and intestines (gastrointestinal tract). Things to avoid include:  High-fiber foods, such as dried fruit, raw fruits and vegetables, nuts, seeds, and whole grain foods.  Spicy foods and high-fat foods.  Foods and beverages sweetened with high-fructose corn syrup, honey, or sugar alcohols such as xylitol, sorbitol, and mannitol. WHAT FOODS ARE RECOMMENDED? Grains White rice. White, [place], or pita breads (fresh or toasted), including plain rolls, buns, or bagels. White pasta. Saltine, soda, or graham crackers. Pretzels. Low-fiber cereal. Cooked cereals made with water (such as cornmeal, farina, or cream cereals). Plain muffins. Matzo. Melba toast. Zwieback.  Vegetables Potatoes (without the skin). Strained tomato and vegetable juices. Most well-cooked and canned vegetables without seeds. Tender lettuce. Fruits Cooked or canned applesauce, apricots, cherries, fruit cocktail, grapefruit, peaches, pears, or plums. Fresh bananas, apples without skin, cherries, grapes, cantaloupe, grapefruit, peaches, oranges, or plums.  Meat and Other Protein Products Baked or boiled chicken. Eggs. Tofu. Fish. Seafood. Smooth  peanut butter. Ground or well-cooked tender beef, ham, veal, lamb, pork, or poultry.  Dairy Plain yogurt, kefir, and unsweetened liquid yogurt. Lactose-free milk, buttermilk, or soy milk. Plain hard cheese. Beverages Sport drinks. Clear broths. Diluted fruit juices (except prune). Regular, caffeine-free sodas such as ginger ale. Water. Decaffeinated teas. Oral rehydration solutions. Sugar-free beverages not sweetened with sugar alcohols. Other Bouillon, broth, or soups made from recommended foods.  The items listed above may not be a complete list of recommended foods or beverages. Contact your dietitian for more options. WHAT FOODS ARE NOT RECOMMENDED? Grains Whole grain, whole wheat, bran, or rye breads, rolls, pastas, crackers, and cereals. Wild or brown rice. Cereals that contain more than 2 g of fiber per serving. Corn tortillas or taco shells. Cooked or dry oatmeal. Granola. Popcorn. Vegetables Raw vegetables. Cabbage, broccoli, Brussels sprouts, artichokes, baked beans, beet greens, corn, kale, legumes, peas, sweet potatoes, and yams. Potato skins. Cooked spinach and cabbage. Fruits Dried fruit, including raisins and dates. Raw fruits. Stewed or dried prunes. Fresh apples with skin, apricots, mangoes, pears, raspberries, and strawberries.  Meat and Other Protein Products  Chunky peanut butter. Nuts and seeds. Beans and lentils. Tomasa Blase.  Dairy High-fat cheeses. Milk, chocolate milk, and beverages made with milk, such as milk shakes. Cream. Ice cream. Sweets and Desserts Sweet rolls, doughnuts, and sweet breads. Pancakes and waffles. Fats and Oils Butter. Cream sauces. Margarine. Salad oils. Plain salad dressings. Olives. Avocados.  Beverages Caffeinated beverages (such as coffee, tea, soda, or energy drinks). Alcoholic beverages. Fruit juices with pulp. Prune juice. Soft drinks sweetened with high-fructose corn syrup or sugar alcohols. Other Coconut. Hot sauce. Chili powder. Mayonnaise.  Gravy. Cream-based or milk-based soups.  The items listed above may not be a complete list of foods and beverages to avoid. Contact your dietitian for more information. WHAT SHOULD I DO IF I BECOME DEHYDRATED? Diarrhea can sometimes lead to dehydration. Signs of dehydration include dark urine and dry mouth and skin. If you think you are dehydrated, you should rehydrate with an oral rehydration solution. These solutions can be purchased at pharmacies, retail stores, or online.  Drink -1 cup (120-240 mL) of oral rehydration solution each time you have an episode of diarrhea. If drinking this amount makes your diarrhea worse, try drinking smaller amounts more often. For example, drink 1-3 tsp (5-15 mL) every 5-10 minutes.  A general rule for staying hydrated is to drink 1-2 L of fluid per day. Talk to your health care provider about the specific amount you should be drinking each day. Drink enough fluids to keep your urine clear or pale yellow. Document Released: 09/28/2003 Document Revised: 07/13/2013 Document Reviewed: 05/31/2013 [facility] Patient Information 2015 [place], [place]. This information is not intended to replace advice given to you by your health care provider. Make sure you discuss any questions you have with your health care provider.

## 2014-09-15 NOTE — MAU Note (Signed)
Pt reports she has had a virus, was eval here on Tuesday. Continued to have diarrhea ( at least 7 watery stools today) has been taking the meds we prescribed but they are not helping. Nausea.

## 2014-09-23 ENCOUNTER — Other Ambulatory Visit: Payer: Self-pay

## 2014-09-23 LAB — OB RESULTS CONSOLE GC/CHLAMYDIA
Chlamydia: NEGATIVE
GC PROBE AMP, GENITAL: NEGATIVE

## 2014-09-23 LAB — OB RESULTS CONSOLE RUBELLA ANTIBODY, IGM: Rubella: IMMUNE

## 2014-09-23 LAB — OB RESULTS CONSOLE HEPATITIS B SURFACE ANTIGEN: Hepatitis B Surface Ag: NEGATIVE

## 2014-09-26 LAB — CYTOLOGY - PAP

## 2015-03-09 ENCOUNTER — Other Ambulatory Visit: Payer: Self-pay | Admitting: Obstetrics & Gynecology

## 2015-03-09 LAB — OB RESULTS CONSOLE GC/CHLAMYDIA
CHLAMYDIA, DNA PROBE: POSITIVE
GC PROBE AMP, GENITAL: NEGATIVE

## 2015-03-09 LAB — OB RESULTS CONSOLE GBS: STREP GROUP B AG: NEGATIVE

## 2015-03-13 ENCOUNTER — Telehealth (HOSPITAL_COMMUNITY): Payer: Self-pay

## 2015-03-18 ENCOUNTER — Inpatient Hospital Stay (HOSPITAL_COMMUNITY)
Admission: AD | Admit: 2015-03-18 | Discharge: 2015-03-23 | DRG: 774 | Disposition: A | Discharge status: Home / Self Care | Payer: Medicaid Other | Source: Ambulatory Visit | Attending: Obstetrics and Gynecology | Admitting: Obstetrics and Gynecology

## 2015-03-18 ENCOUNTER — Encounter (HOSPITAL_COMMUNITY): Payer: Self-pay | Admitting: *Deleted

## 2015-03-18 DIAGNOSIS — O99334 Smoking (tobacco) complicating childbirth: Secondary | ICD-10-CM | POA: Diagnosis present

## 2015-03-18 DIAGNOSIS — D573 Sickle-cell trait: Secondary | ICD-10-CM | POA: Diagnosis present

## 2015-03-18 DIAGNOSIS — O1493 Unspecified pre-eclampsia, third trimester: Principal | ICD-10-CM | POA: Diagnosis present

## 2015-03-18 DIAGNOSIS — O99214 Obesity complicating childbirth: Secondary | ICD-10-CM | POA: Diagnosis present

## 2015-03-18 DIAGNOSIS — Z3490 Encounter for supervision of normal pregnancy, unspecified, unspecified trimester: Secondary | ICD-10-CM

## 2015-03-18 DIAGNOSIS — O9832 Other infections with a predominantly sexual mode of transmission complicating childbirth: Secondary | ICD-10-CM | POA: Diagnosis present

## 2015-03-18 DIAGNOSIS — D509 Iron deficiency anemia, unspecified: Secondary | ICD-10-CM | POA: Diagnosis present

## 2015-03-18 DIAGNOSIS — R030 Elevated blood-pressure reading, without diagnosis of hypertension: Secondary | ICD-10-CM | POA: Diagnosis present

## 2015-03-18 DIAGNOSIS — O99013 Anemia complicating pregnancy, third trimester: Secondary | ICD-10-CM | POA: Diagnosis present

## 2015-03-18 DIAGNOSIS — A568 Sexually transmitted chlamydial infection of other sites: Secondary | ICD-10-CM | POA: Diagnosis present

## 2015-03-18 DIAGNOSIS — O9902 Anemia complicating childbirth: Secondary | ICD-10-CM | POA: Diagnosis present

## 2015-03-18 DIAGNOSIS — Z6838 Body mass index (BMI) 38.0-38.9, adult: Secondary | ICD-10-CM

## 2015-03-18 DIAGNOSIS — Z3A36 36 weeks gestation of pregnancy: Secondary | ICD-10-CM | POA: Diagnosis present

## 2015-03-18 DIAGNOSIS — Z8249 Family history of ischemic heart disease and other diseases of the circulatory system: Secondary | ICD-10-CM

## 2015-03-18 DIAGNOSIS — O1494 Unspecified pre-eclampsia, complicating childbirth: Secondary | ICD-10-CM

## 2015-03-18 LAB — URINALYSIS, ROUTINE W REFLEX MICROSCOPIC
Bilirubin Urine: NEGATIVE
Glucose, UA: NEGATIVE mg/dL
Hgb urine dipstick: NEGATIVE
KETONES UR: NEGATIVE mg/dL
NITRITE: NEGATIVE
Protein, ur: NEGATIVE mg/dL
UROBILINOGEN UA: 1 mg/dL (ref 0.0–1.0)
pH: 5.5 (ref 5.0–8.0)

## 2015-03-18 LAB — PROTEIN / CREATININE RATIO, URINE
CREATININE, URINE: 179 mg/dL
PROTEIN CREATININE RATIO: 0.2 mg/mg{creat} — AB (ref 0.00–0.15)
TOTAL PROTEIN, URINE: 35 mg/dL

## 2015-03-18 LAB — COMPREHENSIVE METABOLIC PANEL
ALT: 14 U/L (ref 14–54)
ANION GAP: 7 (ref 5–15)
AST: 26 U/L (ref 15–41)
Albumin: 2 g/dL — ABNORMAL LOW (ref 3.5–5.0)
Alkaline Phosphatase: 344 U/L — ABNORMAL HIGH (ref 38–126)
BUN: <5 mg/dL — ABNORMAL LOW (ref 6–20)
CHLORIDE: 106 mmol/L (ref 101–111)
CO2: 24 mmol/L (ref 22–32)
CREATININE: 0.63 mg/dL (ref 0.44–1.00)
Calcium: 8.5 mg/dL — ABNORMAL LOW (ref 8.9–10.3)
GFR calc Af Amer: >60 mL/min (ref >60)
Glucose, Bld: 81 mg/dL (ref 65–99)
POTASSIUM: 4.2 mmol/L (ref 3.5–5.1)
SODIUM: 137 mmol/L (ref 135–145)
Total Bilirubin: 0.6 mg/dL (ref 0.3–1.2)
Total Protein: 5.3 g/dL — ABNORMAL LOW (ref 6.5–8.1)

## 2015-03-18 LAB — PREPARE RBC (CROSSMATCH)

## 2015-03-18 LAB — CBC WITH DIFFERENTIAL/PLATELET
Basophils Absolute: 0 10*3/uL (ref 0.0–0.1)
Basophils Relative: 0 % (ref 0–1)
EOS ABS: 0.1 10*3/uL (ref 0.0–0.7)
EOS PCT: 1 % (ref 0–5)
HCT: 21.9 % — ABNORMAL LOW (ref 36.0–46.0)
Hemoglobin: 6.5 g/dL — CL (ref 12.0–15.0)
LYMPHS ABS: 1.2 10*3/uL (ref 0.7–4.0)
LYMPHS PCT: 14 % (ref 12–46)
MCH: 18.8 pg — ABNORMAL LOW (ref 26.0–34.0)
MCHC: 29.7 g/dL — AB (ref 30.0–36.0)
MCV: 63.3 fL — AB (ref 78.0–100.0)
MONO ABS: 0.8 10*3/uL (ref 0.1–1.0)
MONOS PCT: 9 % (ref 3–12)
Neutro Abs: 6.1 10*3/uL (ref 1.7–7.7)
Neutrophils Relative %: 75 % (ref 43–77)
Platelets: 249 10*3/uL (ref 150–400)
RBC: 3.46 MIL/uL — AB (ref 3.87–5.11)
RDW: 20.8 % — AB (ref 11.5–15.5)
WBC: 8.1 10*3/uL (ref 4.0–10.5)

## 2015-03-18 LAB — URINE MICROSCOPIC-ADD ON

## 2015-03-18 LAB — URIC ACID: Uric Acid, Serum: 5.5 mg/dL (ref 2.3–6.6)

## 2015-03-18 LAB — LACTATE DEHYDROGENASE: LDH: 279 U/L — AB (ref 98–192)

## 2015-03-18 MED ORDER — SODIUM CHLORIDE 0.9 % IV SOLN
Freq: Once | INTRAVENOUS | Status: AC
  Administered 2015-03-18: 21:00:00 via INTRAVENOUS

## 2015-03-18 MED ORDER — LABETALOL HCL 5 MG/ML IV SOLN
20.0000 mg | INTRAVENOUS | Status: DC | PRN
  Administered 2015-03-19 – 2015-03-20 (×2): 20 mg via INTRAVENOUS
  Filled 2015-03-18: qty 4

## 2015-03-18 MED ORDER — DIPHENHYDRAMINE HCL 50 MG/ML IJ SOLN
25.0000 mg | Freq: Four times a day (QID) | INTRAMUSCULAR | Status: DC | PRN
  Administered 2015-03-18: 25 mg via INTRAVENOUS
  Filled 2015-03-18: qty 1

## 2015-03-18 MED ORDER — CALCIUM CARBONATE ANTACID 500 MG PO CHEW: 2.0000 | CHEWABLE_TABLET | ORAL | Status: DC | PRN

## 2015-03-18 MED ORDER — AZITHROMYCIN 1 G PO PACK
1.0000 g | PACK | Freq: Once | ORAL | Status: AC
Start: 2015-03-18 — End: 2015-03-18
  Administered 2015-03-18: 1 g via ORAL
  Filled 2015-03-18: qty 1

## 2015-03-18 MED ORDER — ACETAMINOPHEN 325 MG PO TABS
650.0000 mg | ORAL_TABLET | ORAL | Status: DC | PRN
  Administered 2015-03-18: 650 mg via ORAL
  Filled 2015-03-18: qty 2

## 2015-03-18 MED ORDER — ZOLPIDEM TARTRATE 5 MG PO TABS
5.0000 mg | ORAL_TABLET | Freq: Every evening | ORAL | Status: DC | PRN
  Administered 2015-03-19 – 2015-03-20 (×2): 5 mg via ORAL
  Filled 2015-03-18 (×2): qty 1

## 2015-03-18 MED ORDER — DOCUSATE SODIUM 100 MG PO CAPS
100.0000 mg | ORAL_CAPSULE | Freq: Every day | ORAL | Status: DC
  Administered 2015-03-19: 100 mg via ORAL
  Filled 2015-03-18: qty 1

## 2015-03-18 MED ORDER — PRENATAL MULTIVITAMIN CH
1.0000 | ORAL_TABLET | Freq: Every day | ORAL | Status: DC
  Administered 2015-03-19: 1 via ORAL
  Filled 2015-03-18: qty 1

## 2015-03-18 NOTE — MAU Note (Signed)
Pt states here for increased swelling in both legs from knees down. Swelling typically goes down after bath/elevation, however r leg didn't decrease in size. Denies bleeding or abnormal discharge. Denies headache, dizziness, blurred vision.

## 2015-03-18 NOTE — MAU Provider Note (Signed)
History     CSN: [id number]  Arrival date and time: 03/18/15 1512   First Provider Initiated Contact with Patient 03/18/15 1621      Chief Complaint  Patient presents with  . Leg Swelling   HPI Summer Lee 24 y.o. [redacted]w[redacted]d  Comes to MAU with swelling in her lower legs yesterday and today.  Swollen to her knees.  Did not go to work today.  Called the office to ask if it is OK to wait until her appointment to be seen and was advised to come in today.  Legs are painful and she says they feel like they are burning inside when they are touched.  Having pain behind her right knee.  Baby is moving well.  No leaking or vaginal bleeding.   OB History    Gravida Para Term Preterm AB TAB SAB Ectopic Multiple Living   1 0        0      Past Medical History  Diagnosis Date  . Medical history non-contributory     Past Surgical History  Procedure Laterality Date  . Lumbar puncture      Family History  Problem Relation Age of Onset  . Hypertension Mother   . Hypertension Father   . Hypertension Maternal Grandmother   . Hypertension Paternal Grandmother   . Hypertension Paternal Grandfather     Social History  Substance Use Topics  . Smoking status: Current Every Day Smoker -- 0.25 packs/day  . Smokeless tobacco: None  . Alcohol Use: No    Allergies: No Known Allergies  Prescriptions prior to admission  Medication Sig Dispense Refill Last Dose  . Doxylamine-Pyridoxine (DICLEGIS) 10-10 MG TBEC Take 2 tablets by mouth at bedtime as needed. (Patient not taking: Reported on 09/13/2014) 100 tablet 2 Not Taking at Unknown time  . promethazine (PHENERGAN) 25 MG tablet Take 1 tablet (25 mg total) by mouth every 6 (six) hours as needed for nausea. (Patient not taking: Reported on 08/17/2014) 20 tablet 0   . promethazine (PHENERGAN) 25 MG tablet Take 1 tablet (25 mg total) by mouth every 6 (six) hours as needed for nausea or vomiting. 30 tablet 0     Review of Systems  Constitutional:  Negative for fever.  Eyes: Negative for blurred vision.  Gastrointestinal: Negative for nausea, vomiting and abdominal pain.  Genitourinary:       No vaginal discharge. No vaginal bleeding. No dysuria.  Musculoskeletal:       Edema of lower legs and ankles bilaterally  Neurological: Negative for headaches.   Physical Exam   Blood pressure 146/91, pulse 90, temperature 98.7 F (37.1 C), temperature source Oral, resp. rate 18, height  (1.676 m), weight 230 lb 6 oz (104.497 kg), last menstrual period 06/30/2014.  Physical Exam  Nursing note and vitals reviewed. Constitutional: She is oriented to person, place, and time. She appears well-developed and well-nourished.  HENT:  Head: Normocephalic.  Eyes: EOM are normal.  Neck: Neck supple.  GI: Soft. There is no tenderness.  FHT 130 baseline with moderate variability.  Accels of 15x15  - reactive strip.  Not in labor.  Musculoskeletal: Normal range of motion.  Legs are swollen bilaterally beginning below the knees.  Color change - hypopigmented due to edema.  No change in temperature on legs or behind the knees.  Feels burning when touched but no specific area of discomfort.  No area of redness.  3-4+ edema at ankles.  Neurological: She is alert and  oriented to person, place, and time.  Skin: Skin is warm and dry.  Psychiatric: She has a normal mood and affect.    MAU Course  Procedures  MDM Labs done.  Dr. Mora Appl to come to see patient due to elevated BP and edema.  She will assume care.  Assessment and Plan    BURLESON,TERRI 03/18/2015, 4:21 PM

## 2015-03-18 NOTE — Progress Notes (Signed)
CRITICAL VALUE ALERT  Critical value received:  Hgb 6.5, HCT 21.9  Date of notification:  03/18/2015  Time of notification:  1743  Critical value read back:Yes  Nurse who received alert:  Bobbe Medico, RNC  MD notified (1st page):  Lilyan Punt, NP  Time of first page:  N/A  MD notified (2nd page):  Time of second page:  Responding MD:  N/A  Time MD responded:  N/A

## 2015-03-18 NOTE — H&P (Addendum)
Summer Lee is a 24 y.o. female presents to hospital c/o marked swelling of her lower extremities  Right > left.  She also notes mild HA, no visual changes, no chest pain or shortness of breath,  No RUQ pain.  Her PNC has been complicated by anemia for which patient has been non-compliant with iron therapy -  She has sickle cell trait.  She also has recent diagnosis of chlamydia and she has not taken antibiotics.  Her blood pressures in office have been normal    Maternal Medical History:  Reason for admission: Nausea. High blood pressure, severe anemia  Fetal activity: Perceived fetal activity is normal.    Prenatal complications: PIH.   Prenatal Complications - Diabetes: none.    OB History    Gravida Para Term Preterm AB TAB SAB Ectopic Multiple Living   1 0        0     Past Medical History  Diagnosis Date  . Medical history non-contributory    Past Surgical History  Procedure Laterality Date  . Lumbar puncture     Family History: family history includes Hypertension in her father, maternal grandmother, mother, paternal grandfather, and paternal grandmother. Social History:  reports that she has been smoking.  She does not have any smokeless tobacco history on file. She reports that she does not drink alcohol or use illicit drugs.   Prenatal Transfer Tool  Maternal Diabetes: No Genetic Screening: Normal Maternal Ultrasounds/Referrals: Normal Fetal Ultrasounds or other Referrals:  Other: normal Maternal Substance Abuse:  No Significant Maternal Medications:  None Significant Maternal Lab Results:  Lab values include: Group B Strep negative ; Active Chlamydia infection Other Comments:  sickle cell trait; FOB neg  Review of Systems  Constitutional: Negative for fever and chills.  Eyes: Negative for blurred vision and double vision.  Respiratory: Negative for cough and hemoptysis.   Cardiovascular: Positive for leg swelling. Negative for chest pain and  palpitations.  Gastrointestinal: Negative for heartburn and nausea.  Genitourinary: Negative for dysuria and urgency.  Musculoskeletal: Negative for myalgias and neck pain.  Neurological: Positive for headaches. Negative for dizziness, tingling, seizures and loss of consciousness.      Blood pressure 167/93, pulse 94, temperature 98.1 F (36.7 C), temperature source Oral, resp. rate 18, height  (1.676 m), weight 104.497 kg (230 lb 6 oz), last menstrual period 06/30/2014. Maternal Exam:  Uterine Assessment: Contraction frequency is rare.   Abdomen: Patient reports no abdominal tenderness. Fundal height is 37 cm.   Estimated fetal weight is 2400 grams.   Fetal presentation: vertex  Introitus: Normal vulva. Normal vagina.  Amniotic fluid character: not assessed.  Cervix: not evaluated.   Fetal Exam Fetal Monitor Review: Baseline rate: 145.  Variability: moderate (6-25 bpm).   Pattern: no decelerations and accelerations present.    Fetal State Assessment: Category I - tracings are normal.     Physical Exam  Vitals reviewed. Constitutional: She is oriented to person, place, and time. She appears well-developed and well-nourished.  HENT:  Head: Normocephalic.  Eyes: Pupils are equal, round, and reactive to light.  Neck: Normal range of motion.  Cardiovascular: Normal rate and regular rhythm.   Respiratory: Effort normal.  GI: Soft. There is no tenderness.  Musculoskeletal: Normal range of motion. She exhibits edema.  Neurological: She is alert and oriented to person, place, and time. She has normal reflexes.  Skin: Skin is warm.    Prenatal labs: ABO, Rh: --/--/O POS (01/27 1411) Antibody:  neg Rubella:  Immune RPR: Non Reactive (01/27 1411)  HBsAg:   NR HIV:   NEG GBS:   NEG  Assessment/Plan: 24 yo G1P0 at 36 weeks 2 days with elevated BPs in MAU from 130s to 171 / 80s to 90s PEC labs were drawn and liver and renal function were normal.  Platelets also  normal. However Hb noted to be critical value at 6.5 Patient counseled at length that she may be delivered in the next few days if her BPs continue To be elevated. We discussed that with any type of delivery there is blood loss and her starting Hb is very low.  We agreed to do a blood transfusion with admission of 2 Units PRBCs.  She will be monitored closely for elevated BPs if severe range, will deliver.  If mild range will consider d/c home with delivery at 37 weeks.  Will treat chlamydia while she is present in hospital 1GM Azithromycin  Daine Floras, Bre P[place] STACIA 03/18/2015, 7:30 PM

## 2015-03-19 LAB — CBC
HCT: 27.3 % — ABNORMAL LOW (ref 36.0–46.0)
HEMOGLOBIN: 8.3 g/dL — AB (ref 12.0–15.0)
MCH: 20.2 pg — AB (ref 26.0–34.0)
MCHC: 30.4 g/dL (ref 30.0–36.0)
MCV: 66.6 fL — ABNORMAL LOW (ref 78.0–100.0)
Platelets: 233 10*3/uL (ref 150–400)
RBC: 4.1 MIL/uL (ref 3.87–5.11)
RDW: 24.3 % — ABNORMAL HIGH (ref 11.5–15.5)
WBC: 8.7 10*3/uL (ref 4.0–10.5)

## 2015-03-19 MED ORDER — SODIUM CHLORIDE 0.9 % IV SOLN
INTRAVENOUS | Status: DC
  Administered 2015-03-19 (×3): via INTRAVENOUS

## 2015-03-19 MED ORDER — MAGNESIUM SULFATE 50 % IJ SOLN
2.0000 g/h | INTRAVENOUS | Status: DC
  Administered 2015-03-19 – 2015-03-21 (×4): 2 g/h via INTRAVENOUS
  Filled 2015-03-19 (×4): qty 80

## 2015-03-19 MED ORDER — TERBUTALINE SULFATE 1 MG/ML IJ SOLN: 0.2500 mg | Freq: Once | INTRAMUSCULAR | Status: DC | PRN

## 2015-03-19 MED ORDER — MISOPROSTOL 25 MCG QUARTER TABLET
25.0000 ug | ORAL_TABLET | ORAL | Status: DC | PRN
  Administered 2015-03-19 – 2015-03-20 (×5): 25 ug via VAGINAL
  Filled 2015-03-19 (×4): qty 0.25
  Filled 2015-03-19: qty 1
  Filled 2015-03-19 (×2): qty 0.25

## 2015-03-19 MED ORDER — MAGNESIUM SULFATE BOLUS VIA INFUSION
4.0000 g | Freq: Once | INTRAVENOUS | Status: AC
  Administered 2015-03-19: 4 g via INTRAVENOUS
  Filled 2015-03-19: qty 500

## 2015-03-19 NOTE — Progress Notes (Signed)
Patient states she does not want this does of cytotec placed until she has a regular diet. Physician informed and patient is allowed to eat a regular diet at this time. CYtotec to be placed after patient finishes eating

## 2015-03-19 NOTE — Progress Notes (Signed)
Bedside US confirmed vtx Misoprostol 25 mcg placed  Cervix: long/ closed/ posterior  Summer Lee STACIA

## 2015-03-19 NOTE — Progress Notes (Signed)
Name: Summer Lee Medical Record Number:  [id number] Date of Birth: 02-16-1991 Date of Service: 03/19/2015  24 y.o. G1P0 [redacted]w[redacted]d HD#1 admitted for elevated BPs and severe anemia. 24 HRS:  Patient received 2 Units PRBCs  BPs mild range overnight Patient with markedly elevated BPs this morning starting at 0700 Ranging from 150's to 180's / 90's to 100s She notes mild HA, no visual changes no RUQ pain  Physical Examination:   Filed Vitals:   03/19/15 0830  BP: 143/88  Pulse: 77  Temp:   Resp:    General appearance - alert, well appearing, and in no distress  Abd  Soft, gravid, nontender Ex SCDs FHTs  140s, moderate variability accels no decels Toco  none DTRs: 2+ Cervix: not evaluated  Results for orders placed or performed during the hospital encounter of 03/18/15 (from the past 24 hour(s))  Urinalysis, Routine w reflex microscopic (not at [facility])     Status: Abnormal   Collection Time: 03/18/15  3:30 PM  Result Value Ref Range   Color, Urine YELLOW YELLOW   APPearance CLEAR CLEAR   Specific Gravity, Urine >1.030 (H) 1.005 - 1.030   pH 5.5 5.0 - 8.0   Glucose, UA NEGATIVE NEGATIVE mg/dL   Hgb urine dipstick NEGATIVE NEGATIVE   Bilirubin Urine NEGATIVE NEGATIVE   Ketones, ur NEGATIVE NEGATIVE mg/dL   Protein, ur NEGATIVE NEGATIVE mg/dL   Urobilinogen, UA 1.0 0.0 - 1.0 mg/dL   Nitrite NEGATIVE NEGATIVE   Leukocytes, UA TRACE (A) NEGATIVE  Protein / creatinine ratio, urine     Status: Abnormal   Collection Time: 03/18/15  3:30 PM  Result Value Ref Range   Creatinine, Urine 179.00 mg/dL   Total Protein, Urine 35 mg/dL   Protein Creatinine Ratio 0.20 (H) 0.00 - 0.15 mg/mg[Cre]  Urine microscopic-add on     Status: Abnormal   Collection Time: 03/18/15  3:30 PM  Result Value Ref Range   Squamous Epithelial / LPF FEW (A) RARE   WBC, UA 0-2 <3 WBC/hpf  CBC with Differential     Status: Abnormal   Collection Time: 03/18/15  5:30 PM  Result Value Ref Range   WBC 8.1 4.0 -  10.5 K/uL   RBC 3.46 (L) 3.87 - 5.11 MIL/uL   Hemoglobin 6.5 (LL) 12.0 - 15.0 g/dL   HCT 40.9 (L) 81.1 - 91.4 %   MCV 63.3 (L) 78.0 - 100.0 fL   MCH 18.8 (L) 26.0 - 34.0 pg   MCHC 29.7 (L) 30.0 - 36.0 g/dL   RDW 78.2 (H) 95.6 - 21.3 %   Platelets 249 150 - 400 K/uL   Neutrophils Relative % 75 43 - 77 %   Neutro Abs 6.1 1.7 - 7.7 K/uL   Lymphocytes Relative 14 12 - 46 %   Lymphs Abs 1.2 0.7 - 4.0 K/uL   Monocytes Relative 9 3 - 12 %   Monocytes Absolute 0.8 0.1 - 1.0 K/uL   Eosinophils Relative 1 0 - 5 %   Eosinophils Absolute 0.1 0.0 - 0.7 K/uL   Basophils Relative 0 0 - 1 %   Basophils Absolute 0.0 0.0 - 0.1 K/uL  Comprehensive metabolic panel     Status: Abnormal   Collection Time: 03/18/15  5:30 PM  Result Value Ref Range   Sodium 137 135 - 145 mmol/L   Potassium 4.2 3.5 - 5.1 mmol/L   Chloride 106 101 - 111 mmol/L   CO2 24 22 - 32 mmol/L   Glucose, Bld  81 65 - 99 mg/dL   BUN <5 (L) 6 - 20 mg/dL   Creatinine, Ser 1.61 0.44 - 1.00 mg/dL   Calcium 8.5 (L) 8.9 - 10.3 mg/dL   Total Protein 5.3 (L) 6.5 - 8.1 g/dL   Albumin 2.0 (L) 3.5 - 5.0 g/dL   AST 26 15 - 41 U/L   ALT 14 14 - 54 U/L   Alkaline Phosphatase 344 (H) 38 - 126 U/L   Total Bilirubin 0.6 0.3 - 1.2 mg/dL   GFR calc non Af Amer >60 >60 mL/min   GFR calc Af Amer >60 >60 mL/min   Anion gap 7 5 - 15  Uric acid     Status: None   Collection Time: 03/18/15  5:30 PM  Result Value Ref Range   Uric Acid, Serum 5.5 2.3 - 6.6 mg/dL  Lactate dehydrogenase     Status: Abnormal   Collection Time: 03/18/15  5:30 PM  Result Value Ref Range   LDH 279 (H) 98 - 192 U/L  Type and screen for Red Blood Exchange     Status: None (Preliminary result)   Collection Time: 03/18/15  7:25 PM  Result Value Ref Range   ABO/RH(D) O POS    Antibody Screen NEG    Sample Expiration 03/21/2015    Unit Number [id number]    Blood Component Type RED CELLS,LR    Unit division 00    Status of Unit ISSUED    Transfusion Status OK TO  TRANSFUSE    Crossmatch Result Compatible    Unit Number [id number]    Blood Component Type RED CELLS,LR    Unit division 00    Status of Unit ISSUED    Transfusion Status OK TO TRANSFUSE    Crossmatch Result Compatible   Prepare RBC     Status: None   Collection Time: 03/18/15  7:29 PM  Result Value Ref Range   Order Confirmation ORDER PROCESSED BY BLOOD BANK   CBC     Status: Abnormal   Collection Time: 03/19/15  6:58 AM  Result Value Ref Range   WBC 8.7 4.0 - 10.5 K/uL   RBC 4.10 3.87 - 5.11 MIL/uL   Hemoglobin 8.3 (L) 12.0 - 15.0 g/dL   HCT 65.7 (L) 84.6 - 96.2 %   MCV 66.6 (L) 78.0 - 100.0 fL   MCH 20.2 (L) 26.0 - 34.0 pg   MCHC 30.4 30.0 - 36.0 g/dL   RDW 95.2 (H) 84.1 - 32.4 %   Platelets 233 150 - 400 K/uL    A:  HD#1  [redacted]w[redacted]d with gestational HTN vs preeclampsia with severe features, severe iron deficiency anemia, Active chlamydia infection  P: Discussed case with Dr. Vincenza Hews, MFM who agreed patient should be delivered. We discussed route vaginal vs c-section as patient has active  Chlamydia infection. She recommended we proceed with Induction for vaginal delivery.  Magnesium sulfate for seizure prophylaxis Misoprostol for cervical ripening Continue close monitoring IV labetalol for severe range BPs.  Hb 8.5 s/p 2 Units, patient high risk for PPH.    Liana Camerer STACIA

## 2015-03-20 DIAGNOSIS — Z3490 Encounter for supervision of normal pregnancy, unspecified, unspecified trimester: Secondary | ICD-10-CM

## 2015-03-20 LAB — CBC
HEMATOCRIT: 28.4 % — AB (ref 36.0–46.0)
HEMOGLOBIN: 8.8 g/dL — AB (ref 12.0–15.0)
MCH: 20.6 pg — ABNORMAL LOW (ref 26.0–34.0)
MCHC: 31 g/dL (ref 30.0–36.0)
MCV: 66.5 fL — ABNORMAL LOW (ref 78.0–100.0)
Platelets: 244 10*3/uL (ref 150–400)
RBC: 4.27 MIL/uL (ref 3.87–5.11)
RDW: 24.3 % — ABNORMAL HIGH (ref 11.5–15.5)
WBC: 8.8 10*3/uL (ref 4.0–10.5)

## 2015-03-20 LAB — TYPE AND SCREEN
ABO/RH(D): O POS
Antibody Screen: NEGATIVE
Unit division: 0
Unit division: 0

## 2015-03-20 LAB — COMPREHENSIVE METABOLIC PANEL
ALBUMIN: 2.1 g/dL — AB (ref 3.5–5.0)
ALK PHOS: 353 U/L — AB (ref 38–126)
ALT: 13 U/L — AB (ref 14–54)
AST: 23 U/L (ref 15–41)
Anion gap: 7 (ref 5–15)
BUN: <5 mg/dL — ABNORMAL LOW (ref 6–20)
CALCIUM: 7.7 mg/dL — AB (ref 8.9–10.3)
CHLORIDE: 107 mmol/L (ref 101–111)
CO2: 22 mmol/L (ref 22–32)
CREATININE: 0.66 mg/dL (ref 0.44–1.00)
GFR calc Af Amer: >60 mL/min (ref >60)
GFR calc non Af Amer: >60 mL/min (ref >60)
GLUCOSE: 89 mg/dL (ref 65–99)
Potassium: 3.6 mmol/L (ref 3.5–5.1)
SODIUM: 136 mmol/L (ref 135–145)
Total Bilirubin: 0.4 mg/dL (ref 0.3–1.2)
Total Protein: 6 g/dL — ABNORMAL LOW (ref 6.5–8.1)

## 2015-03-20 LAB — LACTATE DEHYDROGENASE: LDH: 197 U/L — ABNORMAL HIGH (ref 98–192)

## 2015-03-20 LAB — URIC ACID: URIC ACID, SERUM: 5.2 mg/dL (ref 2.3–6.6)

## 2015-03-20 LAB — RPR: RPR: NONREACTIVE

## 2015-03-20 MED ORDER — DIPHENHYDRAMINE HCL 50 MG/ML IJ SOLN: 12.5000 mg | INTRAMUSCULAR | Status: DC | PRN

## 2015-03-20 MED ORDER — BUTORPHANOL TARTRATE 1 MG/ML IJ SOLN
2.0000 mg | Freq: Once | INTRAMUSCULAR | Status: AC
  Administered 2015-03-20: 2 mg via INTRAVENOUS
  Filled 2015-03-20: qty 2

## 2015-03-20 MED ORDER — EPHEDRINE 5 MG/ML INJ
10.0000 mg | INTRAVENOUS | Status: DC | PRN
  Filled 2015-03-20: qty 2
  Filled 2015-03-20: qty 4

## 2015-03-20 MED ORDER — LIDOCAINE HCL (PF) 1 % IJ SOLN
30.0000 mL | INTRAMUSCULAR | Status: DC | PRN
  Filled 2015-03-20: qty 30

## 2015-03-20 MED ORDER — OXYTOCIN 40 UNITS IN LACTATED RINGERS INFUSION - SIMPLE MED
1.0000 m[IU]/min | INTRAVENOUS | Status: DC
  Administered 2015-03-20: 1 m[IU]/min via INTRAVENOUS
  Filled 2015-03-20: qty 1000

## 2015-03-20 MED ORDER — OXYTOCIN BOLUS FROM INFUSION
500.0000 mL | INTRAVENOUS | Status: DC
  Administered 2015-03-21: 500 mL via INTRAVENOUS

## 2015-03-20 MED ORDER — LACTATED RINGERS IV SOLN
INTRAVENOUS | Status: DC
  Administered 2015-03-20 – 2015-03-21 (×2): via INTRAVENOUS

## 2015-03-20 MED ORDER — CITRIC ACID-SODIUM CITRATE 334-500 MG/5ML PO SOLN
30.0000 mL | ORAL | Status: DC | PRN
  Filled 2015-03-20: qty 30

## 2015-03-20 MED ORDER — PHENYLEPHRINE 40 MCG/ML (10ML) SYRINGE FOR IV PUSH (FOR BLOOD PRESSURE SUPPORT)
80.0000 ug | PREFILLED_SYRINGE | INTRAVENOUS | Status: DC | PRN
  Filled 2015-03-20: qty 2
  Filled 2015-03-20: qty 20

## 2015-03-20 MED ORDER — LACTATED RINGERS IV SOLN
500.0000 mL | INTRAVENOUS | Status: DC | PRN
  Administered 2015-03-21 (×2): 500 mL via INTRAVENOUS

## 2015-03-20 MED ORDER — BETAMETHASONE SOD PHOS & ACET 6 (3-3) MG/ML IJ SUSP
12.0000 mg | INTRAMUSCULAR | Status: AC
  Administered 2015-03-20 – 2015-03-21 (×2): 12 mg via INTRAMUSCULAR
  Filled 2015-03-20 (×2): qty 2

## 2015-03-20 MED ORDER — TERBUTALINE SULFATE 1 MG/ML IJ SOLN: 0.2500 mg | Freq: Once | INTRAMUSCULAR | Status: DC | PRN

## 2015-03-20 MED ORDER — FENTANYL 2.5 MCG/ML BUPIVACAINE 1/10 % EPIDURAL INFUSION (WH - ANES)
14.0000 mL/h | INTRAMUSCULAR | Status: DC | PRN
  Administered 2015-03-21: 14 mL/h via EPIDURAL
  Filled 2015-03-20 (×2): qty 125

## 2015-03-20 MED ORDER — ONDANSETRON HCL 4 MG/2ML IJ SOLN: 4.0000 mg | Freq: Four times a day (QID) | INTRAMUSCULAR | Status: DC | PRN

## 2015-03-20 MED ORDER — OXYTOCIN 40 UNITS IN LACTATED RINGERS INFUSION - SIMPLE MED: 62.5000 mL/h | INTRAVENOUS | Status: DC

## 2015-03-20 NOTE — Progress Notes (Signed)
Pt feeling moderate discomfort with contraction. Ctx to frequently for cytotec. Foley bulb placed, will start pitocin for cervical ripening/IOL Cat 1 FHT TOCO q2-5 SVE 1/50/-3 Continue other current mgmt

## 2015-03-20 NOTE — Progress Notes (Signed)
Comfortable but feeling moderate contractions. FHT: 125 mod var +accels no decels TOCO q3-4 SVE: 5-6/70/-1 AROM clear Continue pitocin augmentation Epidural when desired.

## 2015-03-21 ENCOUNTER — Encounter (HOSPITAL_COMMUNITY): Payer: Self-pay | Admitting: *Deleted

## 2015-03-21 ENCOUNTER — Inpatient Hospital Stay (HOSPITAL_COMMUNITY): Payer: Medicaid Other | Admitting: Anesthesiology

## 2015-03-21 DIAGNOSIS — O1494 Unspecified pre-eclampsia, complicating childbirth: Secondary | ICD-10-CM | POA: Diagnosis present

## 2015-03-21 LAB — COMPREHENSIVE METABOLIC PANEL
ALBUMIN: 2.1 g/dL — AB (ref 3.5–5.0)
ALT: 12 U/L — ABNORMAL LOW (ref 14–54)
ANION GAP: 4 — AB (ref 5–15)
AST: 23 U/L (ref 15–41)
Alkaline Phosphatase: 301 U/L — ABNORMAL HIGH (ref 38–126)
BILIRUBIN TOTAL: 0.6 mg/dL (ref 0.3–1.2)
BUN: 5 mg/dL — ABNORMAL LOW (ref 6–20)
CO2: 23 mmol/L (ref 22–32)
Calcium: 7.7 mg/dL — ABNORMAL LOW (ref 8.9–10.3)
Chloride: 108 mmol/L (ref 101–111)
Creatinine, Ser: 0.64 mg/dL (ref 0.44–1.00)
Glucose, Bld: 92 mg/dL (ref 65–99)
POTASSIUM: 4.2 mmol/L (ref 3.5–5.1)
Sodium: 135 mmol/L (ref 135–145)
TOTAL PROTEIN: 6.1 g/dL — AB (ref 6.5–8.1)

## 2015-03-21 LAB — CBC
HEMATOCRIT: 33.4 % — AB (ref 36.0–46.0)
HEMATOCRIT: 31 % — AB (ref 36.0–46.0)
HEMATOCRIT: 28 % — AB (ref 36.0–46.0)
HEMOGLOBIN: 9.6 g/dL — AB (ref 12.0–15.0)
Hemoglobin: 10.3 g/dL — ABNORMAL LOW (ref 12.0–15.0)
Hemoglobin: 8.6 g/dL — ABNORMAL LOW (ref 12.0–15.0)
MCH: 20.4 pg — ABNORMAL LOW (ref 26.0–34.0)
MCH: 20.6 pg — AB (ref 26.0–34.0)
MCH: 20.3 pg — AB (ref 26.0–34.0)
MCHC: 30.8 g/dL (ref 30.0–36.0)
MCHC: 31 g/dL (ref 30.0–36.0)
MCHC: 30.7 g/dL (ref 30.0–36.0)
MCV: 66.1 fL — ABNORMAL LOW (ref 78.0–100.0)
MCV: 66.5 fL — ABNORMAL LOW (ref 78.0–100.0)
MCV: 66.2 fL — AB (ref 78.0–100.0)
PLATELETS: 285 10*3/uL (ref 150–400)
Platelets: 287 10*3/uL (ref 150–400)
Platelets: 285 10*3/uL (ref 150–400)
RBC: 5.05 MIL/uL (ref 3.87–5.11)
RBC: 4.66 MIL/uL (ref 3.87–5.11)
RBC: 4.23 MIL/uL (ref 3.87–5.11)
RDW: 25.2 % — AB (ref 11.5–15.5)
RDW: 25.3 % — AB (ref 11.5–15.5)
RDW: 25.2 % — AB (ref 11.5–15.5)
WBC: 21.3 10*3/uL — AB (ref 4.0–10.5)
WBC: 30 10*3/uL — ABNORMAL HIGH (ref 4.0–10.5)
WBC: 33.7 10*3/uL — ABNORMAL HIGH (ref 4.0–10.5)

## 2015-03-21 LAB — LACTATE DEHYDROGENASE: LDH: 221 U/L — AB (ref 98–192)

## 2015-03-21 LAB — MRSA PCR SCREENING: MRSA BY PCR: NEGATIVE

## 2015-03-21 LAB — URIC ACID: URIC ACID, SERUM: 5.7 mg/dL (ref 2.3–6.6)

## 2015-03-21 MED ORDER — ONDANSETRON HCL 4 MG PO TABS: 4.0000 mg | ORAL_TABLET | ORAL | Status: DC | PRN

## 2015-03-21 MED ORDER — DIPHENHYDRAMINE HCL 25 MG PO CAPS: 25.0000 mg | ORAL_CAPSULE | Freq: Four times a day (QID) | ORAL | Status: DC | PRN

## 2015-03-21 MED ORDER — OXYCODONE-ACETAMINOPHEN 5-325 MG PO TABS: 1.0000 | ORAL_TABLET | ORAL | Status: DC | PRN

## 2015-03-21 MED ORDER — PRENATAL MULTIVITAMIN CH
1.0000 | ORAL_TABLET | Freq: Every day | ORAL | Status: DC
  Administered 2015-03-21 – 2015-03-23 (×3): 1 via ORAL
  Filled 2015-03-21 (×3): qty 1

## 2015-03-21 MED ORDER — LABETALOL HCL 5 MG/ML IV SOLN
20.0000 mg | INTRAVENOUS | Status: DC | PRN
Start: 2015-03-21 — End: 2015-03-22
  Administered 2015-03-21: 20 mg via INTRAVENOUS
  Administered 2015-03-21: 40 mg via INTRAVENOUS
  Filled 2015-03-21: qty 4
  Filled 2015-03-21: qty 16

## 2015-03-21 MED ORDER — HYDRALAZINE HCL 20 MG/ML IJ SOLN: 10.0000 mg | Freq: Once | INTRAMUSCULAR | Status: DC | PRN

## 2015-03-21 MED ORDER — TETANUS-DIPHTH-ACELL PERTUSSIS 5-2.5-18.5 LF-MCG/0.5 IM SUSP
0.5000 mL | Freq: Once | INTRAMUSCULAR | Status: DC
  Filled 2015-03-21: qty 0.5

## 2015-03-21 MED ORDER — ONDANSETRON HCL 4 MG/2ML IJ SOLN: 4.0000 mg | INTRAMUSCULAR | Status: DC | PRN

## 2015-03-21 MED ORDER — ACETAMINOPHEN 325 MG PO TABS
650.0000 mg | ORAL_TABLET | ORAL | Status: DC | PRN
  Administered 2015-03-23: 650 mg via ORAL
  Filled 2015-03-21: qty 2

## 2015-03-21 MED ORDER — SIMETHICONE 80 MG PO CHEW: 80.0000 mg | CHEWABLE_TABLET | ORAL | Status: DC | PRN

## 2015-03-21 MED ORDER — LACTATED RINGERS IV SOLN: INTRAVENOUS | Status: DC

## 2015-03-21 MED ORDER — DIBUCAINE 1 % RE OINT: 1.0000 "application " | TOPICAL_OINTMENT | RECTAL | Status: DC | PRN

## 2015-03-21 MED ORDER — SENNOSIDES-DOCUSATE SODIUM 8.6-50 MG PO TABS
2.0000 | ORAL_TABLET | ORAL | Status: DC
  Administered 2015-03-21 – 2015-03-23 (×2): 2 via ORAL
  Filled 2015-03-21: qty 2

## 2015-03-21 MED ORDER — BENZOCAINE-MENTHOL 20-0.5 % EX AERO: 1.0000 "application " | INHALATION_SPRAY | CUTANEOUS | Status: DC | PRN

## 2015-03-21 MED ORDER — LANOLIN HYDROUS EX OINT: TOPICAL_OINTMENT | CUTANEOUS | Status: DC | PRN

## 2015-03-21 MED ORDER — IBUPROFEN 600 MG PO TABS
600.0000 mg | ORAL_TABLET | Freq: Four times a day (QID) | ORAL | Status: DC
  Administered 2015-03-21 – 2015-03-23 (×8): 600 mg via ORAL
  Filled 2015-03-21 (×8): qty 1

## 2015-03-21 MED ORDER — LIDOCAINE HCL (PF) 1 % IJ SOLN
INTRAMUSCULAR | Status: DC | PRN
  Administered 2015-03-21 (×2): 5 mL

## 2015-03-21 MED ORDER — FENTANYL 2.5 MCG/ML BUPIVACAINE 1/10 % EPIDURAL INFUSION (WH - ANES)
INTRAMUSCULAR | Status: DC | PRN
  Administered 2015-03-21: 14 mL/h via EPIDURAL

## 2015-03-21 MED ORDER — FENTANYL 2.5 MCG/ML BUPIVACAINE 1/10 % EPIDURAL INFUSION (WH - ANES): 14.0000 mL/h | INTRAMUSCULAR | Status: DC | PRN

## 2015-03-21 MED ORDER — ZOLPIDEM TARTRATE 5 MG PO TABS: 5.0000 mg | ORAL_TABLET | Freq: Every evening | ORAL | Status: DC | PRN

## 2015-03-21 MED ORDER — OXYCODONE-ACETAMINOPHEN 5-325 MG PO TABS: 2.0000 | ORAL_TABLET | ORAL | Status: DC | PRN

## 2015-03-21 MED ORDER — WITCH HAZEL-GLYCERIN EX PADS: 1.0000 "application " | MEDICATED_PAD | CUTANEOUS | Status: DC | PRN

## 2015-03-21 NOTE — Anesthesia Procedure Notes (Signed)
Epidural Patient location during procedure: OB Start time: 03/21/2015 12:20 AM End time: 03/21/2015 12:40 AM  Staffing Anesthesiologist: Sebastian Ache  Preanesthetic Checklist Completed: patient identified, site marked, surgical consent, pre-op evaluation, timeout performed, IV checked, risks and benefits discussed and monitors and equipment checked  Epidural Patient position: sitting Prep: site prepped and draped and DuraPrep Patient monitoring: heart rate, continuous pulse ox and blood pressure Approach: midline Location: L3-L4 Injection technique: LOR air  Needle:  Needle type: Tuohy  Needle gauge: 17 G Needle length: 9 cm and 9 Needle insertion depth: 7 cm Catheter type: closed end flexible Catheter size: 19 Gauge Catheter at skin depth: 15 cm Test dose: negative  Assessment Events: blood not aspirated, injection not painful, no injection resistance, negative IV test and no paresthesia  Additional Notes   Patient tolerated the insertion well without complications.Reason for block:procedure for pain

## 2015-03-21 NOTE — Anesthesia Postprocedure Evaluation (Signed)
  Anesthesia Post-op Note  Patient: Summer Lee  Procedure(s) Performed: * No procedures listed *  Patient Location: A-ICU  Anesthesia Type:Epidural  Level of Consciousness: awake, alert , oriented and patient cooperative  Airway and Oxygen Therapy: Patient Spontanous Breathing  Post-op Pain: none  Post-op Assessment: Post-op Vital signs reviewed, Patient's Cardiovascular Status Stable, Respiratory Function Stable, Patent Airway, No headache, No backache and Patient able to bend at knees  Post-op Vital Signs: Reviewed and stable  Last Vitals:  Filed Vitals:   03/21/15 1300  BP: 159/110  Pulse: 94  Temp:   Resp:     Complications: No apparent anesthesia complications

## 2015-03-21 NOTE — Lactation Note (Signed)
This note was copied from the chart of Summer Lee. Lactation Consultation Note Mom has large nipples and baby has small mouth. Wanted assistance w/hand expression. Mom used DEBP obtained 22m colostrum. Hand expression and breast massage demonstrated w/total of 646mcolostrum to give to baby. Mom plans on pumping and bottle feeding until the baby's mouth gets a little larger. Reviewed pump kit assembly again. Patient Name: Summer Lee: 03/21/2015 Reason for consult: Follow-up assessment;Infant < 6lbs   Maternal Data Has patient been taught Hand Expression?: Yes Does the patient have breastfeeding experience prior to this delivery?: No  Feeding Feeding Type: Breast Milk Nipple Type: Slow - flow  LATCH Score/Interventions Intervention(s): Breast massage;Breast compression  Intervention(s): Hand expression  Type of Nipple: Everted at rest and after stimulation  Comfort (Breast/Nipple): Soft / non-tender     Hold (Positioning): Assistance needed to correctly position infant at breast and maintain latch. Intervention(s): Breastfeeding basics reviewed;Support Pillows;Skin to skin     Lactation Tools Discussed/Used Tools: Pump Breast pump type: Double-Electric Breast Pump Pump Review: Setup, frequency, and cleaning;Milk Storage Initiated by:: LaNeta EhlersN   Consult Status Consult Status: Follow-up Date: 03/22/15 (in pm) Follow-up type: In-patient    CATheodoro Kalata/30/2016, 11:52 PM

## 2015-03-21 NOTE — Lactation Note (Signed)
This note was copied from the chart of Summer Whitney Duerksen. Lactation Consultation Note  Initial visit made.  Breastfeeding consultation services and support information given to patient.  Discussed late preterm feeding norms.  Baby is 7 hours old and has not had a sufficient feeding.  Mom is motivated and took breastfeeding class.  Taught hand expression and colostrum easily expressed into baby's mouth.  Mom has large nipples and baby's mouth is small making latch very difficult.  Attempted latching with assist for about 10 minutes with no latch or sucking elicited.  DEBP set up and initiated.  Mom will give baby 10 mls of formula per bottle.  Plan is to continue to attempt latch with feeding cues, pump breasts every 3 hours x 15 minutes followed by hand expression and supplement with 10 mls of formula/EBM.  Encouraged to call with concerns/assist prn.  Patient Name: Summer Lee [id number] Date: 03/21/2015 Reason for consult: Initial assessment;Infant < 6lbs;Late preterm infant;Difficult latch   Maternal Data    Feeding Feeding Type: Breast Fed Length of feed: 0 min  LATCH Score/Interventions Latch: Too sleepy or reluctant, no latch achieved, no sucking elicited. Intervention(s): Skin to skin;Teach feeding cues;Waking techniques Intervention(s): Adjust position;Assist with latch;Breast massage;Breast compression  Audible Swallowing: None Intervention(s): Skin to skin  Type of Nipple: Everted at rest and after stimulation  Comfort (Breast/Nipple): Soft / non-tender     Hold (Positioning): Assistance needed to correctly position infant at breast and maintain latch. Intervention(s): Breastfeeding basics reviewed;Support Pillows;Position options;Skin to skin  LATCH Score: 5  Lactation Tools Discussed/Used Pump Review: Setup, frequency, and cleaning;Milk Storage Initiated by:: LC Date initiated:: 03/21/15   Consult Status Consult Status: Follow-up Date:  03/22/15 Follow-up type: In-patient    Huston Foley 03/21/2015, 6:39 PM

## 2015-03-21 NOTE — Anesthesia Preprocedure Evaluation (Signed)
Anesthesia Evaluation  Patient identified by MRN, date of birth, ID band Patient awake and Patient confused    Reviewed: Allergy & Precautions, H&P , NPO status , Patient's Chart, lab work & pertinent test results  Airway Mallampati: II       Dental   Pulmonary Current Smoker,  breath sounds clear to auscultation  Pulmonary exam normal       Cardiovascular Exercise Tolerance: Good hypertension, Normal cardiovascular examRhythm:regular Rate:Normal     Neuro/Psych    GI/Hepatic   Endo/Other  Morbid obesityBMI 38  Renal/GU      Musculoskeletal   Abdominal   Peds  Hematology   Anesthesia Other Findings   Reproductive/Obstetrics (+) Pregnancy PIH,   Mg SO4, Labetolol                             Anesthesia Physical Anesthesia Plan  ASA: III  Anesthesia Plan: Epidural   Post-op Pain Management:    Induction:   Airway Management Planned:   Additional Equipment:   Intra-op Plan:   Post-operative Plan:   Informed Consent: I have reviewed the patients History and Physical, chart, labs and discussed the procedure including the risks, benefits and alternatives for the proposed anesthesia with the patient or authorized representative who has indicated his/her understanding and acceptance.     Plan Discussed with:   Anesthesia Plan Comments:         Anesthesia Quick Evaluation

## 2015-03-22 LAB — CBC
HEMATOCRIT: 27 % — AB (ref 36.0–46.0)
Hemoglobin: 8.6 g/dL — ABNORMAL LOW (ref 12.0–15.0)
MCH: 21.3 pg — ABNORMAL LOW (ref 26.0–34.0)
MCHC: 31.9 g/dL (ref 30.0–36.0)
MCV: 66.8 fL — ABNORMAL LOW (ref 78.0–100.0)
Platelets: 286 10*3/uL (ref 150–400)
RBC: 4.04 MIL/uL (ref 3.87–5.11)
RDW: 25.3 % — AB (ref 11.5–15.5)
WBC: 30.5 10*3/uL — ABNORMAL HIGH (ref 4.0–10.5)

## 2015-03-22 NOTE — Plan of Care (Signed)
Problem: Phase I Progression Outcomes Goal: Other Phase I Outcomes/Goals Outcome: Completed/Met Date Met:  03/22/15 Mag d/c

## 2015-03-22 NOTE — Progress Notes (Signed)
Patient is eating, ambulating, voiding.  Pain control is good.  Appropriate lochia.  No HA/visionchange/RUQ pain.  Feels a little "funny" thinks related to MgSO4.  Filed Vitals:   03/22/15 0900 03/22/15 1013 03/22/15 1107 03/22/15 1217  BP: 156/94 154/90 147/104   Pulse:  90 73   Temp:    97.4 F (36.3 C)  TempSrc:    Axillary  Resp:      Height:      Weight:      SpO2:  100% 100%     Fundus firm Perineum without swelling. Ext: moderate pedal edema, no calf tenderness  Lab Results  Component Value Date   WBC 30.5* 03/22/2015   HGB 8.6* 03/22/2015   HCT 27.0* 03/22/2015   MCV 66.8* 03/22/2015   PLT 286 03/22/2015    --/--/O POS (08/27 1925)  A/P Post partum day 1. Normal and mild range BPs. Magnesium sulfate discontinued. WIll monitorin in [facility] for 4 hours and if pt is doing well, will transfer to postpartum  Routine care.  Expect d/c 9/1.  Summer Lee

## 2015-03-23 NOTE — Progress Notes (Signed)
Patient is eating, ambulating, voiding.  Pain control is good.  Filed Vitals:   03/22/15 2000 03/23/15 0000 03/23/15 0400 03/23/15 0535  BP: 132/88 133/89 132/88 137/88  Pulse: 79 78 79 67  Temp: 98.5 F (36.9 C) 98.4 F (36.9 C) 98.5 F (36.9 C) 98.4 F (36.9 C)  TempSrc: Oral Oral Oral Oral  Resp: Height:      Weight:      SpO2: 100% 100% 100%     Fundus firm Perineum without swelling.  Lab Results  Component Value Date   WBC 30.5* 03/22/2015   HGB 8.6* 03/22/2015   HCT 27.0* 03/22/2015   MCV 66.8* 03/22/2015   PLT 286 03/22/2015    --/--/O POS (08/27 1925)/RI  A/P Post partum day 2.  Routine care.  Expect d/c today.    Steve Youngberg A

## 2015-03-23 NOTE — Progress Notes (Signed)
Patient states she would like to bottle feed formula only. Spoke to her about using an electric breast pump and bottle feeding. Patient states she is not interested in pumping.

## 2015-03-23 NOTE — Progress Notes (Signed)
UR chart review completed.  

## 2015-03-23 NOTE — Discharge Summary (Signed)
Obstetric Discharge Summary Reason for Admission: induction of labor, preeclampsia and anemia Prenatal Procedures: Preeclampsia and blood transfusion for anemia Intrapartum Procedures: spontaneous vaginal delivery Postpartum Procedures: none Complications-Operative and Postpartum: none HEMOGLOBIN  Date Value Ref Range Status  03/22/2015 8.6* 12.0 - 15.0 g/dL Final   HCT  Date Value Ref Range Status  03/22/2015 27.0* 36.0 - 46.0 % Final     Discharge Diagnoses: Term Pregnancy-delivered and Preelampsia  Discharge Information: Date: 03/23/2015 Activity: pelvic rest Diet: routine Medications: Ibuprofen and Iron Condition: stable Instructions: refer to practice specific booklet Discharge to: home Follow-up Information    Follow up with Carolan Shiver, MD On 03/25/2015.   Specialty:  Pediatrics   Contact information:   [address] [place] [address] [phone]       Follow up with Almon Hercules., MD In 4 weeks.   Specialty:  Obstetrics and Gynecology   Contact information:   [address] [place] [address] [phone]       Newborn Data: Live born female  Birth Weight: 5 lb 6.2 oz (2445 g) APGAR: 8, 9  Home with mother.  Ademola Vert A 03/23/2015, 7:24 AM

## 2015-03-24 ENCOUNTER — Ambulatory Visit (HOSPITAL_COMMUNITY): Payer: Medicaid Other

## 2015-03-30 ENCOUNTER — Ambulatory Visit (HOSPITAL_COMMUNITY): Payer: Medicaid Other | Attending: Obstetrics & Gynecology

## 2015-04-28 ENCOUNTER — Other Ambulatory Visit: Payer: Self-pay | Admitting: Obstetrics and Gynecology

## 2015-09-12 ENCOUNTER — Encounter (HOSPITAL_COMMUNITY): Payer: Self-pay | Admitting: *Deleted

## 2015-09-12 ENCOUNTER — Emergency Department (HOSPITAL_COMMUNITY)
Admission: EM | Admit: 2015-09-12 | Discharge: 2015-09-12 | Disposition: A | Discharge status: Home / Self Care | Payer: Self-pay | Attending: Emergency Medicine | Admitting: Emergency Medicine

## 2015-09-12 ENCOUNTER — Emergency Department (HOSPITAL_COMMUNITY): Payer: Self-pay

## 2015-09-12 DIAGNOSIS — R630 Anorexia: Secondary | ICD-10-CM | POA: Insufficient documentation

## 2015-09-12 DIAGNOSIS — R059 Cough, unspecified: Secondary | ICD-10-CM

## 2015-09-12 DIAGNOSIS — R0981 Nasal congestion: Secondary | ICD-10-CM | POA: Insufficient documentation

## 2015-09-12 DIAGNOSIS — R51 Headache: Secondary | ICD-10-CM | POA: Insufficient documentation

## 2015-09-12 DIAGNOSIS — R6889 Other general symptoms and signs: Secondary | ICD-10-CM

## 2015-09-12 DIAGNOSIS — R05 Cough: Secondary | ICD-10-CM | POA: Insufficient documentation

## 2015-09-12 DIAGNOSIS — F17200 Nicotine dependence, unspecified, uncomplicated: Secondary | ICD-10-CM | POA: Insufficient documentation

## 2015-09-12 DIAGNOSIS — R109 Unspecified abdominal pain: Secondary | ICD-10-CM | POA: Insufficient documentation

## 2015-09-12 DIAGNOSIS — R197 Diarrhea, unspecified: Secondary | ICD-10-CM | POA: Insufficient documentation

## 2015-09-12 LAB — COMPREHENSIVE METABOLIC PANEL
ALBUMIN: 3.8 g/dL (ref 3.5–5.0)
ALT: 20 U/L (ref 14–54)
ANION GAP: 8 (ref 5–15)
AST: 28 U/L (ref 15–41)
Alkaline Phosphatase: 58 U/L (ref 38–126)
BUN: <5 mg/dL — ABNORMAL LOW (ref 6–20)
CALCIUM: 9.4 mg/dL (ref 8.9–10.3)
CHLORIDE: 106 mmol/L (ref 101–111)
CO2: 23 mmol/L (ref 22–32)
Creatinine, Ser: 0.86 mg/dL (ref 0.44–1.00)
GFR calc non Af Amer: >60 mL/min (ref >60)
Glucose, Bld: 86 mg/dL (ref 65–99)
POTASSIUM: 3.6 mmol/L (ref 3.5–5.1)
SODIUM: 137 mmol/L (ref 135–145)
Total Bilirubin: 0.3 mg/dL (ref 0.3–1.2)
Total Protein: 8 g/dL (ref 6.5–8.1)

## 2015-09-12 LAB — CBC
HEMATOCRIT: 40.8 % (ref 36.0–46.0)
HEMOGLOBIN: 13.3 g/dL (ref 12.0–15.0)
MCH: 24.1 pg — AB (ref 26.0–34.0)
MCHC: 32.6 g/dL (ref 30.0–36.0)
MCV: 73.9 fL — AB (ref 78.0–100.0)
Platelets: 255 10*3/uL (ref 150–400)
RBC: 5.52 MIL/uL — AB (ref 3.87–5.11)
RDW: 21.2 % — ABNORMAL HIGH (ref 11.5–15.5)
WBC: 6.3 10*3/uL (ref 4.0–10.5)

## 2015-09-12 LAB — URINALYSIS, ROUTINE W REFLEX MICROSCOPIC
Bilirubin Urine: NEGATIVE
Glucose, UA: NEGATIVE mg/dL
HGB URINE DIPSTICK: NEGATIVE
Ketones, ur: 15 mg/dL — AB
Leukocytes, UA: NEGATIVE
Nitrite: NEGATIVE
PH: 5.5 (ref 5.0–8.0)
Protein, ur: NEGATIVE mg/dL
SPECIFIC GRAVITY, URINE: 1.008 (ref 1.005–1.030)

## 2015-09-12 LAB — LIPASE, BLOOD: LIPASE: 27 U/L (ref 11–51)

## 2015-09-12 MED ORDER — ONDANSETRON 4 MG PO TBDP
ORAL_TABLET | ORAL | Status: AC
  Filled 2015-09-12: qty 1

## 2015-09-12 MED ORDER — ACETAMINOPHEN 325 MG PO TABS
ORAL_TABLET | ORAL | Status: AC
  Filled 2015-09-12: qty 2

## 2015-09-12 MED ORDER — ONDANSETRON 4 MG PO TBDP
4.0000 mg | ORAL_TABLET | Freq: Once | ORAL | Status: AC | PRN
  Administered 2015-09-12: 4 mg via ORAL

## 2015-09-12 MED ORDER — ACETAMINOPHEN 325 MG PO TABS
650.0000 mg | ORAL_TABLET | Freq: Once | ORAL | Status: AC
  Administered 2015-09-12: 650 mg via ORAL

## 2015-09-12 MED ORDER — SODIUM CHLORIDE 0.9 % IV BOLUS (SEPSIS)
1000.0000 mL | Freq: Once | INTRAVENOUS | Status: AC
Start: 2015-09-12 — End: 2015-09-12
  Administered 2015-09-12: 1000 mL via INTRAVENOUS

## 2015-09-12 NOTE — Discharge Instructions (Signed)
Continue taking OTC symptom relievers such as tylenol, motrin, delsym, robitussin, "cold and flu" medications. Stay well hydrated. Follow up with your PCP If your symptoms do not improve. Return to ED with new, worsening or concerning symptoms.    Cough, Adult Coughing is a reflex that clears your throat and your airways. Coughing helps to heal and protect your lungs. It is normal to cough occasionally, but a cough that happens with other symptoms or lasts a long time may be a sign of a condition that needs treatment. A cough may last only 2-3 weeks (acute), or it may last longer than 8 weeks (chronic). CAUSES Coughing is commonly caused by:  Breathing in substances that irritate your lungs.  A viral or bacterial respiratory infection.  Allergies.  Asthma.  Postnasal drip.  Smoking.  Acid backing up from the stomach into the esophagus (gastroesophageal reflux).  Certain medicines.  Chronic lung problems, including COPD (or rarely, lung cancer).  Other medical conditions such as heart failure. HOME CARE INSTRUCTIONS  Pay attention to any changes in your symptoms. Take these actions to help with your discomfort:  Take medicines only as told by your health care provider.  If you were prescribed an antibiotic medicine, take it as told by your health care provider. Do not stop taking the antibiotic even if you start to feel better.  Talk with your health care provider before you take a cough suppressant medicine.  Drink enough fluid to keep your urine clear or pale yellow.  If the air is dry, use a cold steam vaporizer or humidifier in your bedroom or your home to help loosen secretions.  Avoid anything that causes you to cough at work or at home.  If your cough is worse at night, try sleeping in a semi-upright position.  Avoid cigarette smoke. If you smoke, quit smoking. If you need help quitting, ask your health care provider.  Avoid caffeine.  Avoid alcohol.  Rest as  needed. SEEK MEDICAL CARE IF:   You have new symptoms.  You cough up pus.  Your cough does not get better after 2-3 weeks, or your cough gets worse.  You cannot control your cough with suppressant medicines and you are losing sleep.  You develop pain that is getting worse or pain that is not controlled with pain medicines.  You have a fever.  You have unexplained weight loss.  You have night sweats. SEEK IMMEDIATE MEDICAL CARE IF:  You cough up blood.  You have difficulty breathing.  Your heartbeat is very fast.   This information is not intended to replace advice given to you by your health care provider. Make sure you discuss any questions you have with your health care provider.   Document Released: 01/04/2011 Document Revised: 03/29/2015 Document Reviewed: 09/14/2014 Elsevier Interactive Patient Education 2016 Else[employer] Inc.  Diarrhea Diarrhea is frequent loose and watery bowel movements. It can cause you to feel weak and dehydrated. Dehydration can cause you to become tired and thirsty, have a dry mouth, and have decreased urination that often is dark yellow. Diarrhea is a sign of another problem, most often an infection that will not last long. In most cases, diarrhea typically lasts 2-3 days. However, it can last longer if it is a sign of something more serious. It is important to treat your diarrhea as directed by your caregiver to lessen or prevent future episodes of diarrhea. CAUSES  Some common causes include:  Gastrointestinal infections caused by viruses, bacteria, or parasites.  Food poisoning or food allergies.  Certain medicines, such as antibiotics, chemotherapy, and laxatives.  Artificial sweeteners and fructose.  Digestive disorders. HOME CARE INSTRUCTIONS  Ensure adequate fluid intake (hydration): Have 1 cup (8 oz) of fluid for each diarrhea episode. Avoid fluids that contain simple sugars or sports drinks, fruit juices, whole milk products, and  sodas. Your urine should be clear or pale yellow if you are drinking enough fluids. Hydrate with an oral rehydration solution that you can purchase at pharmacies, retail stores, and online. You can prepare an oral rehydration solution at home by mixing the following ingredients together:   - tsp table salt.   tsp baking soda.   tsp salt substitute containing potassium chloride.  1  tablespoons sugar.  1 L (34 oz) of water.  Certain foods and beverages may increase the speed at which food moves through the gastrointestinal (GI) tract. These foods and beverages should be avoided and include:  Caffeinated and alcoholic beverages.  High-fiber foods, such as raw fruits and vegetables, nuts, seeds, and whole grain breads and cereals.  Foods and beverages sweetened with sugar alcohols, such as xylitol, sorbitol, and mannitol.  Some foods may be well tolerated and may help thicken stool including:  Starchy foods, such as rice, toast, pasta, low-sugar cereal, oatmeal, grits, baked potatoes, crackers, and bagels.  Bananas.  Applesauce.  Add probiotic-rich foods to help increase healthy bacteria in the GI tract, such as yogurt and fermented milk products.  Wash your hands well after each diarrhea episode.  Only take over-the-counter or prescription medicines as directed by your caregiver.  Take a warm bath to relieve any burning or pain from frequent diarrhea episodes. SEEK IMMEDIATE MEDICAL CARE IF:   You are unable to keep fluids down.  You have persistent vomiting.  You have blood in your stool, or your stools are black and tarry.  You do not urinate in 6-8 hours, or there is only a small amount of very dark urine.  You have abdominal pain that increases or localizes.  You have weakness, dizziness, confusion, or light-headedness.  You have a severe headache.  Your diarrhea gets worse or does not get better.  You have a fever or persistent symptoms for more than 2-3  days.  You have a fever and your symptoms suddenly get worse. MAKE SURE YOU:   Understand these instructions.  Will watch your condition.  Will get help right away if you are not doing well or get worse.   This information is not intended to replace advice given to you by your health care provider. Make sure you discuss any questions you have with your health care provider.   Document Released: 06/28/2002 Document Revised: 07/29/2014 Document Reviewed: 03/15/2012 Elsevier Interactive Patient Education [employer]

## 2015-09-12 NOTE — ED Provider Notes (Signed)
History  By signing my name below, I, Karle Plumber, attest that this documentation has been prepared under the direction and in the presence of Misk Galentine, PA-C. Electronically Signed: Karle Plumber, ED Scribe. 09/12/2015. 2:39 PM.  Chief Complaint  Patient presents with  . Cough   The history is provided by the patient and medical records. No language interpreter was used.    HPI Comments:  Summer Lee is a 25 y.o. female who presents to the Emergency Department complaining of non-productive cough that began two days ago. She reports associated throbbing, frontal HA, mild nasal congestion, decreased appetite and diarrhea after eating. She states that her stomach begins cramping after she eats and this is relieved with a bowel movement. She denies associated nausea and vomiting. She states that this has resulted in a decrease appetite. She denies purulent nasal discharge or rhinorrhea. She states she had a fever at triage but was did not feel feverish. Pt reports drinking a lot of hot tea for symptom control. She reports taking Tylenol and DayQuil yesterday with no significant relief of the symptoms. She denies exacerbating or alleviating factors of her symptoms. She denies dizziness, syncope, vision changes, neck pain, rhinorrhea, otalgia, sore throat, wheezing, chest pain, dysuria or hematuria. She did not have a flu vaccination this year. No known sick contacts. No other complaints today.  Past Medical History  Diagnosis Date  . Medical history non-contributory    Past Surgical History  Procedure Laterality Date  . Lumbar puncture     Family History  Problem Relation Age of Onset  . Hypertension Mother   . Hypertension Father   . Hypertension Maternal Grandmother   . Hypertension Paternal Grandmother   . Hypertension Paternal Grandfather    Social History  Substance Use Topics  . Smoking status: Current Every Day Smoker -- 0.25 packs/day  . Smokeless tobacco: None   . Alcohol Use: No   OB History    Gravida Para Term Preterm AB TAB SAB Ectopic Multiple Living   0 1     Review of Systems  Constitutional: Positive for fever.  HENT: Positive for congestion.   Respiratory: Positive for cough.   Gastrointestinal: Positive for diarrhea.  Neurological: Positive for headaches.  All other systems reviewed and are negative.   Allergies  Review of patient's allergies indicates no known allergies.  Home Medications   Prior to Admission medications   Medication Sig Start Date End Date Taking? Authorizing Provider  acetaminophen (TYLENOL) 500 MG tablet Take 500 mg by mouth every 6 (six) hours as needed for mild pain.   Yes Historical Provider, MD  bismuth subsalicylate (PEPTO BISMOL) 262 MG/15ML suspension Take 30 mLs by mouth every 6 (six) hours as needed for diarrhea or loose stools.   Yes Historical Provider, MD   Triage Vitals: BP 159/111 mmHg  Pulse 118  Temp(Src) 100.9 F (38.3 C) (Oral)  Resp 18  SpO2 98% Physical Exam  Constitutional: She appears well-developed and well-nourished. No distress.  Nontoxic appearing  HENT:  Head: Normocephalic and atraumatic.  Right Ear: Tympanic membrane, external ear and ear canal normal.  Left Ear: Tympanic membrane, external ear and ear canal normal.  Mouth/Throat: Oropharynx is clear and moist. No oropharyngeal exudate.  Eyes: Conjunctivae and EOM are normal. Right eye exhibits no discharge. Left eye exhibits no discharge. No scleral icterus.  Neck: Normal range of motion. Neck supple.  Cardiovascular: Normal rate, regular rhythm and normal heart sounds.  Pulmonary/Chest: Effort normal and breath sounds normal. No respiratory distress. She has no wheezes. She has no rales.  Breathing unlabored. Frequent coughing  Abdominal: Soft. Bowel sounds are normal. She exhibits no distension. There is no tenderness. There is no rebound and no guarding.  Musculoskeletal: Normal range of motion.   Moves all extremities spontaneously  Lymphadenopathy:    She has no cervical adenopathy.  Neurological: She is alert. Coordination normal.  Skin: Skin is warm and dry.  Psychiatric: She has a normal mood and affect. Her behavior is normal.  Nursing note and vitals reviewed.   ED Course  Procedures (including critical care time) DIAGNOSTIC STUDIES: Oxygen Saturation is 98% on RA, normal by my interpretation.   COORDINATION OF CARE: 2:04 PM- Will order CXR and IV fluids. Pt verbalizes understanding and agrees to plan.  Medications  ondansetron (ZOFRAN-ODT) disintegrating tablet 4 mg (4 mg Oral Given 09/12/15 1116)  acetaminophen (TYLENOL) tablet 650 mg (650 mg Oral Given 09/12/15 1116)  sodium chloride 0.9 % bolus 1,000 mL (0 mLs Intravenous Stopped 09/12/15 1546)   Labs Review Labs Reviewed  COMPREHENSIVE METABOLIC PANEL - Abnormal; Notable for the following:    BUN <5 (*)    All other components within normal limits  CBC - Abnormal; Notable for the following:    RBC 5.52 (*)    MCV 73.9 (*)    MCH 24.1 (*)    RDW 21.2 (*)    All other components within normal limits  URINALYSIS, ROUTINE W REFLEX MICROSCOPIC (NOT AT [facility]) - Abnormal; Notable for the following:    Ketones, ur 15 (*)    All other components within normal limits  LIPASE, BLOOD    Imaging Review Dg Chest 2 View  09/12/2015  CLINICAL DATA:  Cough congestion for 2 days, patient smokes EXAM: CHEST  2 VIEW COMPARISON:  None. FINDINGS: The heart size and mediastinal contours are within normal limits. Both lungs are clear. The visualized skeletal structures are unremarkable. IMPRESSION: No active cardiopulmonary disease. Electronically Signed   By: Esperanza Heir M.D.   On: 09/12/2015 14:27   I have personally reviewed and evaluated these images and lab results as part of my medical decision-making.   MDM   Final diagnoses:  Flu-like symptoms  Cough  Diarrhea, unspecified type   25 year old female presenting  with dry cough, frontal headache, mild congestion and postprandial diarrhea 2 days. Symptoms unrelieved with Tylenol and DayQuil at home. Temperature 100.9 degrees in triage which was reduced to 98.7 after receiving Tylenol. Initially hypertensive to 159/111 in triage which also reduced to 137/98 after fluid bolus and pain control. Patient is nontoxic-appearing. She coughs frequently during exam and interview. No nasal mucosa edema noted. Oropharynx clear without erythema or exudate. Lungs clear to auscultation bilaterally. Abdomen is soft, nontender without peritoneal signs. Blood work is largely unremarkable. Chest x-ray is negative for acute disease. Given Tylenol in triage she reports completely resolved her headache. Patient's symptoms consistent with a flulike illness. Patient did not receive her flu shot this year. Symptoms adequately managed in emergency department.  At this time there does not appear to be any evidence of an acute emergency medical condition and the patient appears stable for discharge with appropriate outpatient follow up. Diagnosis was discussed with patient who verbalizes understanding and is agreeable to discharge. Return precautions given in discharge paperwork and discussed with pt at bedside. Pt is stable for discharge.  I personally performed the services described in this documentation, which was scribed  in my presence. The recorded information has been reviewed and is accurate.     Alveta Heimlich, PA-C 09/12/15 1629  Lyndal Pulley, MD 09/12/15 [phone]

## 2015-09-12 NOTE — ED Notes (Signed)
Patient transported to X-ray 

## 2015-09-12 NOTE — ED Notes (Signed)
Pt reports cough, diarhea and headache for 3 days. Pt denies N/V. Pt tearful at triage.

## 2016-09-24 ENCOUNTER — Other Ambulatory Visit: Payer: Self-pay | Admitting: Obstetrics

## 2016-09-24 NOTE — Telephone Encounter (Signed)
See "Reason for call" 

## 2016-09-26 LAB — CYTOLOGY - PAP

## 2016-12-14 IMAGING — US US OB TRANSVAGINAL
1 series · 14 of 28 positions shown · non-contrast
Comparison: None.

CLINICAL DATA: Initial encounter for abdominal pain with positive
pregnancy test.

EXAM:
OBSTETRIC <14 WK US AND TRANSVAGINAL OB US
TECHNIQUE: Both transabdominal and transvaginal ultrasound examinations were
performed for complete evaluation of the gestation as well as the
maternal uterus, adnexal regions, and pelvic cul-de-sac.
Transvaginal technique was performed to assess early pregnancy.

[Series 1: us ob transvaginal · 0.15mm/px · 14 of 55 slices shown]
[im 3/55]
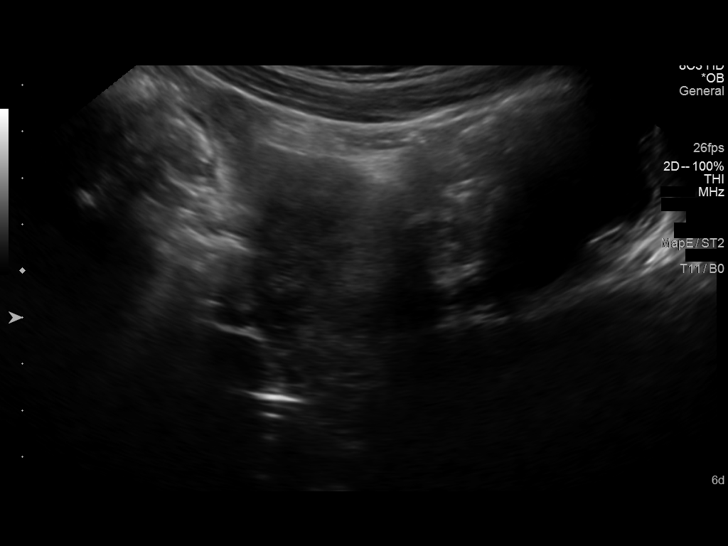
[im 7/55]
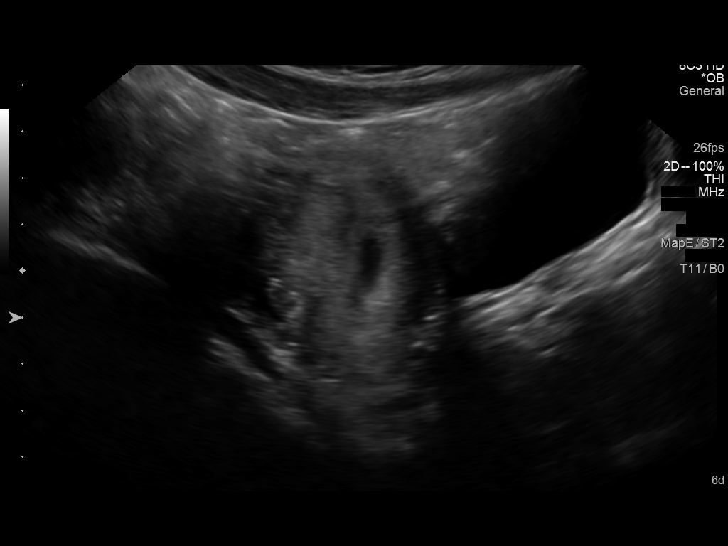
[im 11/55]
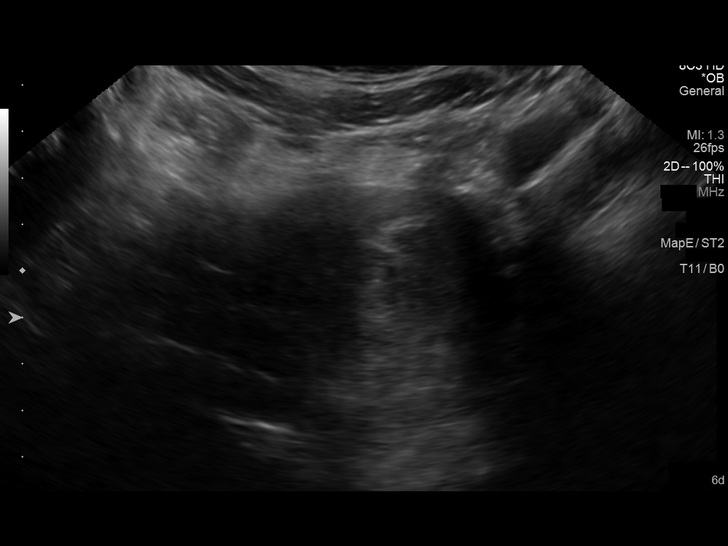
[im 15/55]
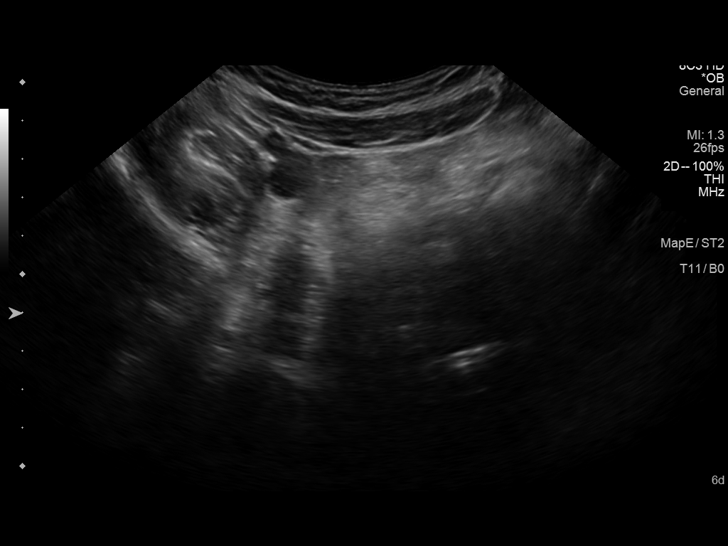
[im 19/55]
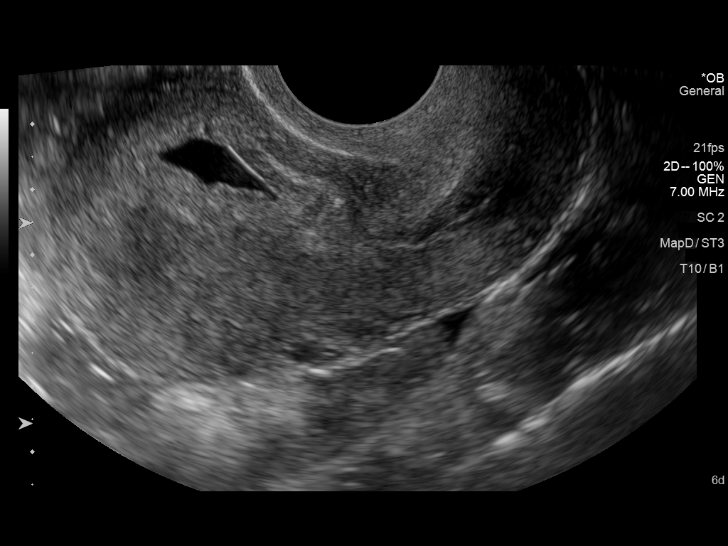
[im 23/55]
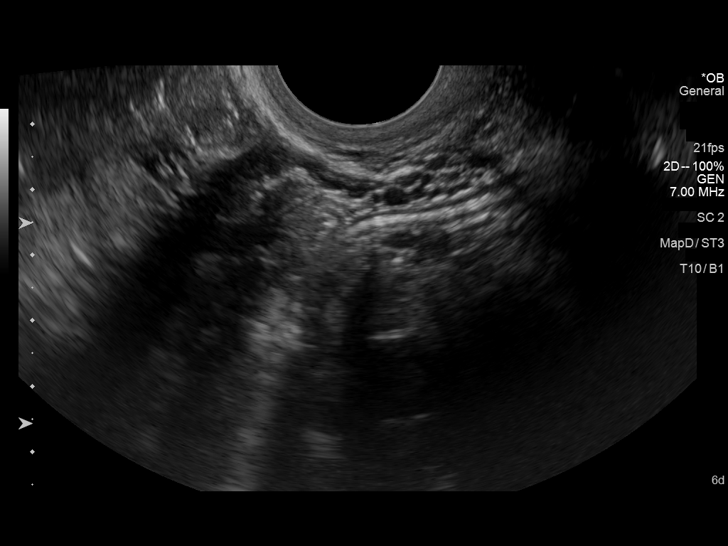
[im 27/55]
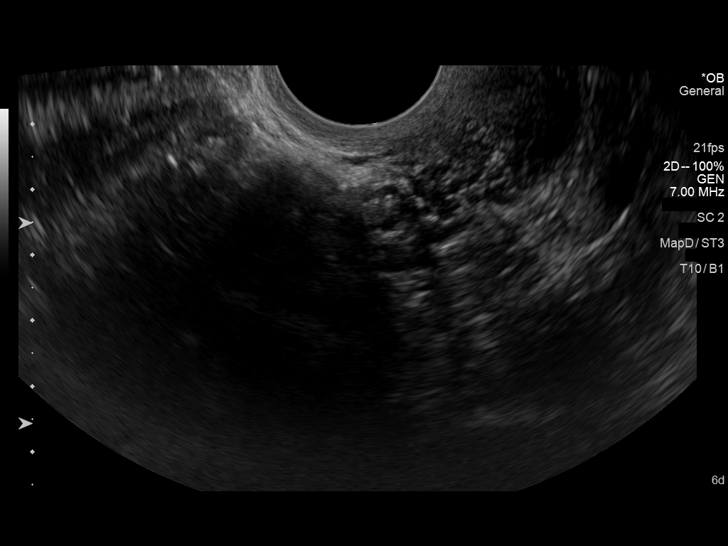
[im 31/55]
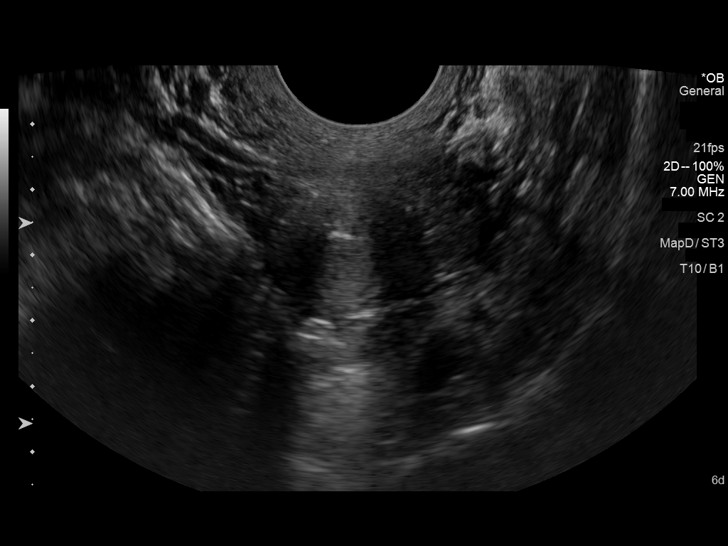
[im 35/55]
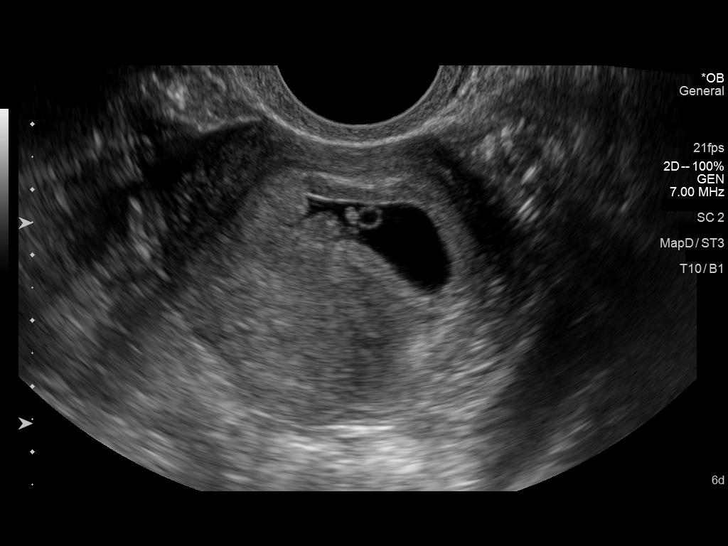
[im 39/55]
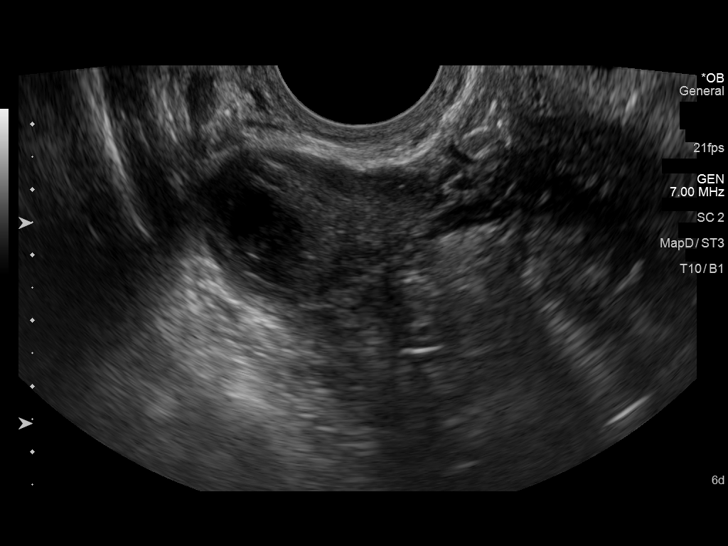
[im 43/55]
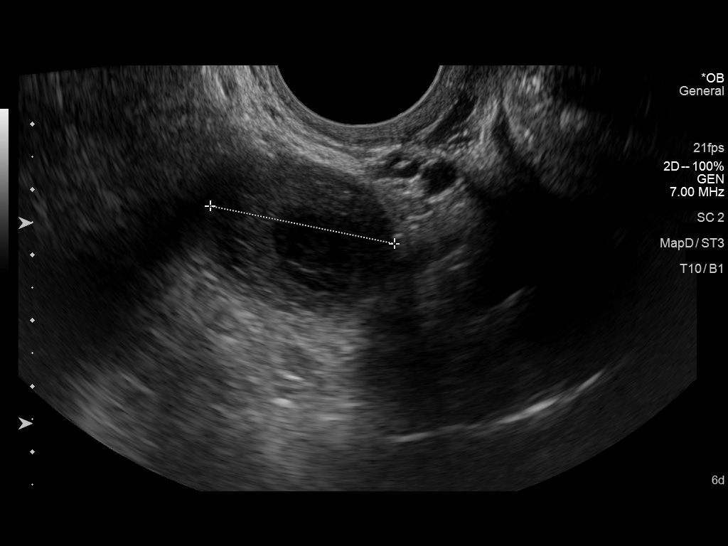
[im 47/55]
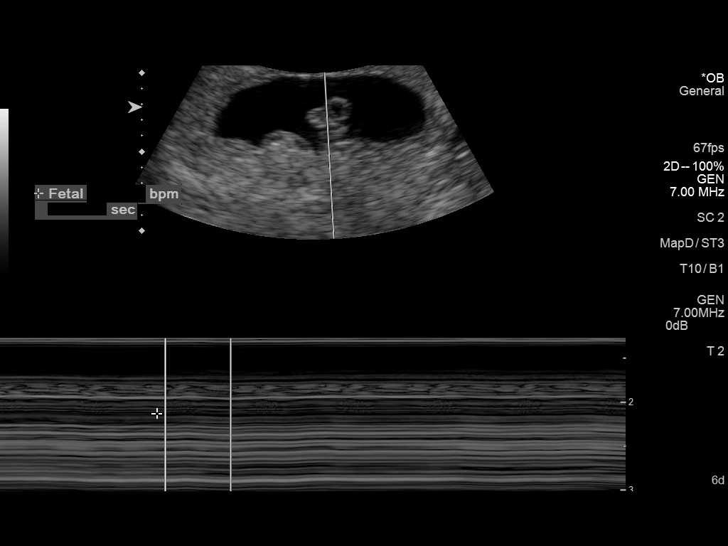
[im 51/55]
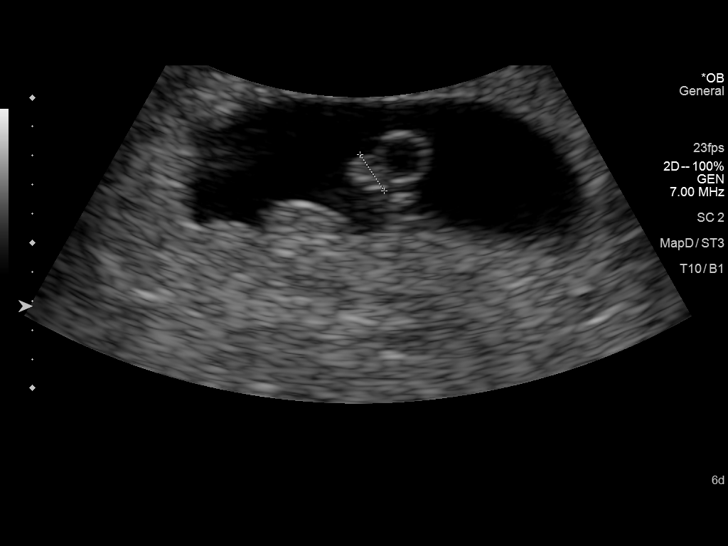
[im 55/55]
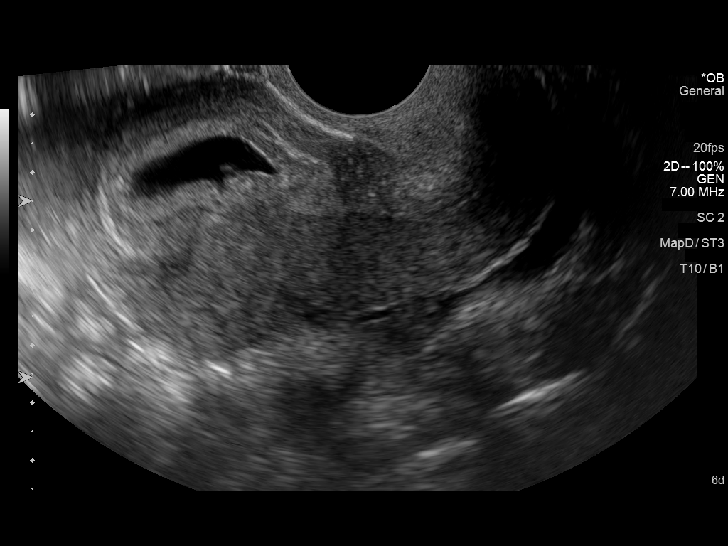

[14 of 28 positions shown; findings below may reference images not displayed]

FINDINGS: Intrauterine gestational sac: Visualized/normal in shape.

Yolk sac:  Visualized

Embryo:  Visualized

Cardiac Activity: Visualized

Heart Rate:  157 bpm

CRL:   3  mm   5 w 6 d                  US EDC: 04/13/2015

Maternal uterus/adnexae: Tiny subchorionic hemorrhage noted. No
adnexal mass.
IMPRESSION: Single living intrauterine gestation at estimated 5 week 6 day
gestational age by crown-rump length.

## 2020-03-24 ENCOUNTER — Other Ambulatory Visit: Payer: Self-pay

## 2020-03-24 ENCOUNTER — Other Ambulatory Visit: Payer: Medicaid Other

## 2020-03-24 DIAGNOSIS — Z20822 Contact with and (suspected) exposure to covid-19: Secondary | ICD-10-CM

## 2020-03-25 LAB — NOVEL CORONAVIRUS, NAA: SARS-CoV-2, NAA: NOT DETECTED

## 2021-09-25 ENCOUNTER — Other Ambulatory Visit: Payer: Self-pay

## 2021-09-25 ENCOUNTER — Encounter: Payer: Self-pay | Admitting: Emergency Medicine

## 2021-09-25 ENCOUNTER — Ambulatory Visit
Admission: EM | Admit: 2021-09-25 | Discharge: 2021-09-25 | Disposition: A | Discharge status: Home / Self Care | Payer: Self-pay | Attending: Emergency Medicine | Admitting: Emergency Medicine

## 2021-09-25 DIAGNOSIS — M7918 Myalgia, other site: Secondary | ICD-10-CM

## 2021-09-25 MED ORDER — BACLOFEN 10 MG PO TABS: 10.0000 mg | ORAL_TABLET | Freq: Every day | ORAL | 0 refills | Status: AC

## 2021-09-25 MED ORDER — NAPROXEN 500 MG PO TABS: 500.0000 mg | ORAL_TABLET | Freq: Two times a day (BID) | ORAL | 0 refills | Status: AC
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
## 2021-09-25 NOTE — Discharge Instructions (Signed)
Over the next several days, you may notice that your muscles and joints become more sore and you may begin to see bruising evolve across your left shoulder, your left breast, your left knee.  You may even notice that in the soft tissue of your left breast that she feels some hard lumps, this typically is bleeding beneath the skin and as the moisture containing blood is reabsorbed, the hard products of iron and heme are left behind.  This is called a hematoma.  Hematomas can take several months to resolve but you can facilitate this by applying warm moist heat to the area several times a day. ? ?To stay ahead of your pain and muscle spasm, provided you with a prescription for naproxen 500 mg that you can take twice daily as needed and baclofen that you can take at bedtime.  Baclofen does cause sleepiness so I recommend that you take it 2 to 3 hours before you anticipate going to sleep so that there is plenty of time for the medication to wear off before you have to get up in the morning. ? ?Thank you for visiting urgent care today.  If you find that despite this regimen you continue to have worsening pain, you are welcome to return to urgent care for reevaluation and more aggressive treatment. ?
